# Patient Record
Sex: Male | Born: 1935 | Race: White | Hispanic: No | Marital: Married | Smoking: Former smoker
Health system: Southern US, Community
[De-identification: ages and names within clinical notes are randomized; demographics above are authoritative.]

## PROBLEM LIST (undated history)

## (undated) DIAGNOSIS — E785 Hyperlipidemia, unspecified: Secondary | ICD-10-CM

## (undated) DIAGNOSIS — A91 Dengue hemorrhagic fever: Secondary | ICD-10-CM

## (undated) DIAGNOSIS — I679 Cerebrovascular disease, unspecified: Secondary | ICD-10-CM

## (undated) DIAGNOSIS — F329 Major depressive disorder, single episode, unspecified: Secondary | ICD-10-CM

## (undated) DIAGNOSIS — I10 Essential (primary) hypertension: Secondary | ICD-10-CM

## (undated) DIAGNOSIS — C61 Malignant neoplasm of prostate: Secondary | ICD-10-CM

## (undated) DIAGNOSIS — I2510 Atherosclerotic heart disease of native coronary artery without angina pectoris: Secondary | ICD-10-CM

## (undated) DIAGNOSIS — F32A Depression, unspecified: Secondary | ICD-10-CM

## (undated) HISTORY — DX: Cerebrovascular disease, unspecified: I67.9

## (undated) HISTORY — DX: Depression, unspecified: F32.A

## (undated) HISTORY — DX: Essential (primary) hypertension: I10

## (undated) HISTORY — PX: OTHER SURGICAL HISTORY: SHX169

## (undated) HISTORY — DX: Dengue hemorrhagic fever: A91

## (undated) HISTORY — DX: Major depressive disorder, single episode, unspecified: F32.9

## (undated) HISTORY — PX: CORONARY ARTERY BYPASS GRAFT: SHX141

## (undated) HISTORY — DX: Malignant neoplasm of prostate: C61

## (undated) HISTORY — DX: Hyperlipidemia, unspecified: E78.5

## (undated) HISTORY — DX: Atherosclerotic heart disease of native coronary artery without angina pectoris: I25.10

---

## 1999-04-09 ENCOUNTER — Other Ambulatory Visit: Admission: RE | Admit: 1999-04-09 | Discharge: 1999-04-09 | Payer: Self-pay | Admitting: Urology

## 1999-05-15 ENCOUNTER — Encounter: Payer: Self-pay | Admitting: Urology

## 1999-05-20 ENCOUNTER — Inpatient Hospital Stay (HOSPITAL_COMMUNITY): Admission: RE | Admit: 1999-05-20 | Discharge: 1999-05-23 | Payer: Self-pay | Admitting: Urology

## 1999-05-20 ENCOUNTER — Encounter: Payer: Self-pay | Admitting: Urology

## 2002-11-01 HISTORY — PX: PROSTATE SURGERY: SHX751

## 2004-09-11 ENCOUNTER — Ambulatory Visit: Payer: Self-pay | Admitting: Family Medicine

## 2005-01-08 ENCOUNTER — Ambulatory Visit: Payer: Self-pay | Admitting: Family Medicine

## 2005-06-07 ENCOUNTER — Ambulatory Visit: Payer: Self-pay | Admitting: Family Medicine

## 2005-06-28 ENCOUNTER — Ambulatory Visit: Payer: Self-pay | Admitting: Family Medicine

## 2005-07-14 ENCOUNTER — Ambulatory Visit: Payer: Self-pay | Admitting: Family Medicine

## 2005-07-19 ENCOUNTER — Ambulatory Visit: Payer: Self-pay | Admitting: Family Medicine

## 2005-07-23 ENCOUNTER — Ambulatory Visit: Payer: Self-pay | Admitting: Family Medicine

## 2005-08-04 ENCOUNTER — Ambulatory Visit: Payer: Self-pay | Admitting: Cardiology

## 2005-08-13 ENCOUNTER — Ambulatory Visit: Payer: Self-pay | Admitting: Family Medicine

## 2005-08-18 ENCOUNTER — Ambulatory Visit: Payer: Self-pay | Admitting: Family Medicine

## 2005-08-23 ENCOUNTER — Ambulatory Visit: Payer: Self-pay

## 2005-10-12 ENCOUNTER — Ambulatory Visit: Payer: Self-pay | Admitting: Family Medicine

## 2005-10-19 ENCOUNTER — Ambulatory Visit: Payer: Self-pay | Admitting: Family Medicine

## 2005-11-08 ENCOUNTER — Ambulatory Visit: Payer: Self-pay | Admitting: Family Medicine

## 2006-01-06 ENCOUNTER — Ambulatory Visit: Payer: Self-pay | Admitting: Family Medicine

## 2007-09-12 ENCOUNTER — Ambulatory Visit: Payer: Self-pay

## 2007-09-12 ENCOUNTER — Ambulatory Visit: Payer: Self-pay | Admitting: Cardiology

## 2008-09-20 ENCOUNTER — Ambulatory Visit: Payer: Self-pay | Admitting: Cardiology

## 2008-10-04 ENCOUNTER — Ambulatory Visit: Payer: Self-pay

## 2009-03-20 DIAGNOSIS — I25810 Atherosclerosis of coronary artery bypass graft(s) without angina pectoris: Secondary | ICD-10-CM | POA: Insufficient documentation

## 2009-03-20 DIAGNOSIS — E785 Hyperlipidemia, unspecified: Secondary | ICD-10-CM

## 2009-03-20 DIAGNOSIS — Z8679 Personal history of other diseases of the circulatory system: Secondary | ICD-10-CM | POA: Insufficient documentation

## 2009-03-20 DIAGNOSIS — I10 Essential (primary) hypertension: Secondary | ICD-10-CM

## 2009-03-24 ENCOUNTER — Encounter: Payer: Self-pay | Admitting: Nurse Practitioner

## 2009-03-24 ENCOUNTER — Ambulatory Visit: Payer: Self-pay | Admitting: Cardiovascular Disease

## 2009-05-26 ENCOUNTER — Telehealth (INDEPENDENT_AMBULATORY_CARE_PROVIDER_SITE_OTHER): Payer: Self-pay | Admitting: *Deleted

## 2009-05-29 ENCOUNTER — Inpatient Hospital Stay (HOSPITAL_COMMUNITY): Admission: RE | Admit: 2009-05-29 | Discharge: 2009-06-02 | Payer: Self-pay | Admitting: Orthopedic Surgery

## 2009-06-26 ENCOUNTER — Encounter (INDEPENDENT_AMBULATORY_CARE_PROVIDER_SITE_OTHER): Payer: Self-pay | Admitting: *Deleted

## 2009-08-08 ENCOUNTER — Encounter: Payer: Self-pay | Admitting: Cardiology

## 2009-10-07 ENCOUNTER — Encounter: Payer: Self-pay | Admitting: Cardiology

## 2011-02-06 LAB — GLUCOSE, CAPILLARY
Glucose-Capillary: 107 mg/dL — ABNORMAL HIGH (ref 70–99)
Glucose-Capillary: 113 mg/dL — ABNORMAL HIGH (ref 70–99)
Glucose-Capillary: 130 mg/dL — ABNORMAL HIGH (ref 70–99)

## 2011-02-06 LAB — CBC
HCT: 30.9 % — ABNORMAL LOW (ref 39.0–52.0)
MCHC: 34.2 g/dL (ref 30.0–36.0)
MCV: 90.1 fL (ref 78.0–100.0)
RBC: 3.43 MIL/uL — ABNORMAL LOW (ref 4.22–5.81)
RDW: 12.4 % (ref 11.5–15.5)
WBC: 6.6 10*3/uL (ref 4.0–10.5)

## 2011-02-07 LAB — COMPREHENSIVE METABOLIC PANEL
ALT: 15 U/L (ref 0–53)
Alkaline Phosphatase: 38 U/L — ABNORMAL LOW (ref 39–117)
CO2: 27 mEq/L (ref 19–32)
Calcium: 9.4 mg/dL (ref 8.4–10.5)
GFR calc non Af Amer: 60 mL/min — ABNORMAL LOW (ref >60)
Glucose, Bld: 97 mg/dL (ref 70–99)
Sodium: 139 mEq/L (ref 135–145)
Total Bilirubin: 0.4 mg/dL (ref 0.3–1.2)

## 2011-02-07 LAB — CROSSMATCH

## 2011-02-07 LAB — DIFFERENTIAL
Basophils Absolute: 0.1 10*3/uL (ref 0.0–0.1)
Basophils Relative: 1 % (ref 0–1)
Eosinophils Absolute: 0.2 10*3/uL (ref 0.0–0.7)
Neutrophils Relative %: 62 % (ref 43–77)

## 2011-02-07 LAB — URINALYSIS, ROUTINE W REFLEX MICROSCOPIC
Bilirubin Urine: NEGATIVE
Hgb urine dipstick: NEGATIVE
Protein, ur: NEGATIVE mg/dL
Urobilinogen, UA: 0.2 mg/dL (ref 0.0–1.0)

## 2011-02-07 LAB — GLUCOSE, CAPILLARY
Glucose-Capillary: 129 mg/dL — ABNORMAL HIGH (ref 70–99)
Glucose-Capillary: 133 mg/dL — ABNORMAL HIGH (ref 70–99)
Glucose-Capillary: 139 mg/dL — ABNORMAL HIGH (ref 70–99)
Glucose-Capillary: 148 mg/dL — ABNORMAL HIGH (ref 70–99)
Glucose-Capillary: 161 mg/dL — ABNORMAL HIGH (ref 70–99)
Glucose-Capillary: 164 mg/dL — ABNORMAL HIGH (ref 70–99)

## 2011-02-07 LAB — CBC
HCT: 29.8 % — ABNORMAL LOW (ref 39.0–52.0)
Hemoglobin: 10.1 g/dL — ABNORMAL LOW (ref 13.0–17.0)
Hemoglobin: 12.9 g/dL — ABNORMAL LOW (ref 13.0–17.0)
Hemoglobin: 9.1 g/dL — ABNORMAL LOW (ref 13.0–17.0)
MCHC: 34.2 g/dL (ref 30.0–36.0)
RBC: 2.91 MIL/uL — ABNORMAL LOW (ref 4.22–5.81)
RBC: 3.25 MIL/uL — ABNORMAL LOW (ref 4.22–5.81)
RBC: 4.14 MIL/uL — ABNORMAL LOW (ref 4.22–5.81)
RDW: 12 % (ref 11.5–15.5)
WBC: 7.7 10*3/uL (ref 4.0–10.5)

## 2011-02-07 LAB — BASIC METABOLIC PANEL
Calcium: 8 mg/dL — ABNORMAL LOW (ref 8.4–10.5)
Calcium: 8.3 mg/dL — ABNORMAL LOW (ref 8.4–10.5)
GFR calc Af Amer: >60 mL/min (ref >60)
GFR calc Af Amer: >60 mL/min (ref >60)
GFR calc non Af Amer: >60 mL/min (ref >60)
GFR calc non Af Amer: >60 mL/min (ref >60)
Glucose, Bld: 136 mg/dL — ABNORMAL HIGH (ref 70–99)
Potassium: 4.8 mEq/L (ref 3.5–5.1)
Sodium: 138 mEq/L (ref 135–145)
Sodium: 140 mEq/L (ref 135–145)

## 2011-02-07 LAB — PROTIME-INR
INR: 1 (ref 0.00–1.49)
Prothrombin Time: 13.9 seconds (ref 11.6–15.2)

## 2011-02-07 LAB — ABO/RH: ABO/RH(D): O POS

## 2011-03-16 NOTE — Assessment & Plan Note (Signed)
[facility]                            CARDIOLOGY OFFICE NOTE   NAME:Sean Lin, Sean Lin                      MRN:          [id number]  DATE:09/12/2007                            DOB:          [birth year]    Sean Lin is a 76 year old gentleman who has a history of coronary  artery bypassing graft in 1998 and preserved LV function.  He also has  mild cerebrovascular disease.  Since I last saw him he occasionally has  mild dyspnea on exertion but there is no orthopnea or PND.  He also  occasionally has mild pedal edema but has had this ever since his bypass  surgery.  He has not had chest pain or syncope.   His medications include:  1. Aspirin 325 mg p.o. daily.  2. Prilosec 20 mg p.o. daily.  3. Albuterol inhaler.  4. Advair.  5. Lipitor 20 mg p.o. daily.  6. Diovan 320 mg.  7. Hydrochlorothiazide.   He is not clear on all of his medications.   PHYSICAL EXAM TODAY:  Shows a blood pressure that is elevated at 175/91  and his pulse is 72.  He weighs 206 pounds.  HEENT:  Normal.  NECK:  Supple.  CHEST:  Clear.  CARDIOVASCULAR:  Reveals a regular rate and rhythm.  ABDOMEN:  Shows no tenderness.  EXTREMITIES:  Show no edema.   Electrocardiogram shows a normal sinus rhythm at a rate of 64.  There  are no ST changes noted.   DIAGNOSES:  1. Coronary artery disease status post coronary artery bypassing graft      - Sean Lin has had no change in his symptoms and he has not had      chest pain.  His Myoview on August 23, 2005, showed an ejection      fraction of 64 and normal perfusion.  We will therefore continue      with medical therapy including his aspirin and statin as well as      his ARB.  2. Hypertension - his blood pressure is elevated today.  I have asked      him to contact us with a complete list of his medications and we      will adjust as indicated.  He will continue to track his blood      pressure at home and we will increase  his medications as needed.      We will have his most recent BMET forwarded to [place] from Dr.      Trey Sailors office.  3. History of cerebrovascular disease - he did have followup carotid      Dopplers today and we will await the final report.  4. Hyperlipidemia - he will continue on his Lipitor and we asked for      his most recent lipids and liver to be forwarded to [place] for our      records.  5. History of prostate cancer status post prostatectomy.   We will see him back in approximately 12 months.  He will continue with  risk factor modification.  Madolyn Frieze Jens Som, MD, [facility]  Electronically Signed    BSC/MedQ  DD: 09/12/2007  DT: 09/12/2007  Job #: [id number]   cc:   Elease Hashimoto A. Benedetto Goad, M.D.

## 2011-03-16 NOTE — Op Note (Signed)
NAME:  Sean Lin, Sean Lin               ACCOUNT NO.:  [id number]   MEDICAL RECORD NO.:  [id number]          PATIENT TYPE:  INP   LOCATION:  0001                         FACILITY:  [facility]   PHYSICIAN:  John L. Rendall, M.D.  DATE OF BIRTH:  06-24-1935   DATE OF PROCEDURE:  05/29/2009  DATE OF DISCHARGE:                               OPERATIVE REPORT   PREOPERATIVE DIAGNOSIS:  Osteoarthritis, right hip.   SURGICAL PROCEDURE:  Right AML total hip replacement.   POSTOPERATIVE DIAGNOSIS:  Osteoarthritis, right hip.   SURGEON:  John L. Rendall, M.D.   ASSISTANT:  Legrand Pitts. Duffy, P.A. --  present and participating  throughout the entire procedure.   ANESTHESIA:  General.   PATHOLOGY:  Bone-against-bone right hip, with pain with each step.  Getting worse to the point where he was requiring chronic mild-to-  moderate pain medication.   PROCEDURE:  Under general anesthesia, the patient was placed in the left  lateral decubitus position and the hip is prepared with DuraPrep and  draped as a sterile field.  He received preoperative antibiotics.  A  posterior approach was made, splitting the IT band in the line of its  fibers; spreading it with a Charnley retractor.  The short external  rotators and hip capsule were taken down from bone with electrocautery.  Multiple small vessels were cauterized.  The hip capsule was then opened  in a T-shaped manner.  The hip is dislocated.  The superior femoral neck  is exposed, and IM initiator and canal finder are used.  Progressive  reaming up to 14.5 is performed.  The femoral neck is then osteotomized,  and an arthritic hip ball is removed.  Rasping is then done; 10.5, 12,  13, 5 and 15 narrow, which gives an excellent fit down in the canal and  through the isthmus.   At this point, attention is turned to the acetabulum.  The ligamentum  Teres is removed.  The wing retractors are inserted in the interval  between the capsule and the labrum.  The  labrum was then excised.  Two  Cobra retractors were placed inferiorly.  Progressive reaming was then  done (47, 49, 51, 53, 54 and 55).  Appropriate deepening of the  acetabulum is done down to nice bleeding bone.  A 54 trial bottoms out  nicely, and a 55 reamer gave a good rim fit.  Decision is made to go to  a 56, 300 series.  This was inserted using the Sputnik to aid in  positioning.  The position of the socket pretty much mirrored the  anatomic rim.  Once this was in place, a trial seating of a +4 10-degree  lip poly was done, and trialed off a rasp -- it was a +2, 36 hip ball.  It was stable through full normal range of motion, but had a positive  shuck test of about a centimeter.  Leg lengths appeared very near equal.   At this point, permanent components are obtained.  The apex hole  eliminator is inserted.  The permanent Pinnacle Marathon acetabular  liner (plus-4 10-degree  liner, 56 outside diameter and 36 inside).  With  this in place, the small stature AML (155 length, 43 offset) was then  inserted.  It seated nicely on the calcar.  Trial of the +2 hip ball  still revealed a little more shuck test than desired, and a +5 trial  revealed excellent stability through full flexion, extension, internal  and external rotation.  The permanent component was then obtained (a 36  +5, with a 12/14 taper).  This was inserted in the usual manner.  The  hip was reduced.  Irrigation was performed.  The hip capsule was then  closed with #1 Tycron.  The piriformis reattached with #1 Tycron.  Short  external rotators loosely reattached, and the IT band closed with #1  Tycron.  The subcutaneous was closed in  2 layers of 0 and  2-0 Vicryl;  and skin with clips.   OPERATIVE TIME:  1 hour.   BLOOD LOSS:  Estimated 200 mL.   Leg lengths equal at the end of the case.      John L. Rendall, M.D.  Electronically Signed     JLR/MEDQ  D:  05/29/2009  T:  05/29/2009  Job:  [id number]

## 2011-03-16 NOTE — Assessment & Plan Note (Signed)
[facility]                            CARDIOLOGY OFFICE NOTE   NAME:Sean Lin, Sean Lin                      MRN:          [id number]  DATE:09/20/2008                            DOB:          Aug 12, 1935    Mr. Mentink is a pleasant gentleman who has a history of coronary artery  bypassing graft in 1998 and preserved LV function.  He also has  cerebrovascular disease.  His last Myoview on August 23, 2005, showed  normal perfusion and an ejection fraction of 64%.  Since I last saw him,  he has dyspnea with more extreme activities, but not with routine  activities.  There is no orthopnea, PND, palpitations, presyncope,  syncope, or exertional chest pain.  Occasionally, he has edema in the  right lower extremity where his veins were harvested.   MEDICATIONS:  1. Aspirin 325 mg p.o. daily.  2. Diovan 320 mg daily.  3. Metformin 500 mg p.o. b.i.d.  4. Lipitor 40 mg p.o. daily.  5. Prilosec.   PHYSICAL EXAMINATION:  VITAL SIGNS:  Today shows a blood pressure of  137/65.  His pulse is 73.  He weighs 193 pounds.  HEENT:  Normal.  NECK:  Supple.  CHEST:  Clear.  CARDIOVASCULAR:  Regular rhythm.  ABDOMEN:  No tenderness.  EXTREMITIES:  No edema.   Electrocardiogram shows a sinus rhythm at a rate of 71.  The axis is  normal.  There are minor nonspecific ST changes.   DIAGNOSES:  1. Coronary artery disease, status post coronary artery bypassing      graft - Mr. Hilliker appears to be doing reasonably well from a      symptomatic standpoint.  He has some dyspnea on exertion.  It has      been 3 years since his last Myoview and we will plan to repeat      that.  If it shows normal perfusion, then we will continue medical      therapy including his aspirin, ARB, and statin.  2. Hypertension - His blood pressure is adequately controlled on his      present medications.  Dr. Benedetto Goad is following his renal function.  3. History of cerebrovascular disease - He  is due for followup carotid      Dopplers and we will arrange those as well.  He will continue on      his aspirin and statin.  4. Hyperlipidemia - He will continue on his statin and this is being      followed by Dr. Benedetto Goad.  5. History of prostate cancer.   He will continue the risk factor modification including diet and  exercise.  He does not smoke.  I will see him back in 1 year.     Madolyn Frieze Jens Som, MD, [facility]  Electronically Signed    BSC/MedQ  DD: 09/20/2008  DT: 09/21/2008  Job #: [id number]   cc:   Elease Hashimoto A. Benedetto Goad, M.D.

## 2011-03-16 NOTE — Discharge Summary (Signed)
NAME:  Sean Lin, Sean Lin               ACCOUNT NO.:  [id number]   MEDICAL RECORD NO.:  [id number]          PATIENT TYPE:  INP   LOCATION:  1614                         FACILITY:  [facility]   PHYSICIAN:  John L. Rendall, M.D.  DATE OF BIRTH:  July 27, 1935   DATE OF ADMISSION:  05/29/2009  DATE OF DISCHARGE:  06/02/2009                               DISCHARGE SUMMARY   ADMISSION DIAGNOSES:  1. End-stage osteoarthritis, right hip.  2. Hypertension.  3. Type 2 diabetes mellitus.  4. Hypercholesterolemia.  5. Coronary artery disease with history of coronary artery bypass      graft.  6. History of skin cancer.  7. History of prostate cancer.  8. History of low back pain.   DISCHARGE DIAGNOSES:  1. End-stage osteoarthritis, right hip, status post right total hip      arthroplasty.  2. Acute blood loss anemia secondary to surgery.  3. Postoperative nausea, now resolved.  4. Hypertension.  5. Type 2 diabetes mellitus.  6. Hypercholesterolemia.  7. Coronary artery disease with history of coronary artery bypass      graft.  8. History of skin cancer.  9. History of prostate cancer.  10.History of low back pain.   SURGICAL PROCEDURES:  On May 29, 2009, Sean Lin underwent a right  total hip arthroplasty by Dr. Jonny Ruiz L.  Rendall, assisted by Arnoldo Morale,  PA-C.  He had a Pinnacle 300 series acetabular cup, size 56-mm placed  with an apex hole eliminator and a Pinnacle Marathon acetabular liner  +4, 10-degree 36-mm inner diameter, 56-mm outer diameter.  An AML small  stature 155-mm length, 43-mm offset size 15 femoral stem with a metal-on-  metal femoral head 36-mm, +5 neck length, 12/14 taper.   COMPLICATIONS:  None.   CONSULTS:  1. Physical therapy consult May 30, 2009, in addition to a case      management consult.  2. Occupational therapy consult May 31, 2009.   HISTORY OF PRESENT ILLNESS:  This 76 year old white male patient  presented to Dr. Priscille Kluver with a 1-year history of  sudden onset  progressive right hip pain without injury or prior surgery.  Pain at  this time is intermittent, sharp ache over the right groin and buttock  with radiation to the knee.  It increases if he turns the wrong way or  walks and then decreases with sitting and lying down.  Tramadol provides  some relief.  X-rays show end-stage arthritic changes and he has failed  conservative treatment.  Because of that, he is presenting for right hip  replacement.   HOSPITAL COURSE:  Sean Lin tolerated his surgical procedure well  without immediate postoperative complications.  He was transferred to  the orthopedic floor.  On postop day 1, he had had some nausea and  vomiting the day before, not sure whether it was due to the Percocet or  anesthesia, but meds were adjusted.  T-max was 99.2, vitals were stable.  Dressing was intact to the right hip.  The leg was neurovascularly  intact.  He was switched to p.o. pain meds.   Postop day  2 he was feeling better.  Nausea had resolved.  Hemoglobin  9.1, hematocrit 26.6.  Incision was well-approximated with staples.  There was no drainage.  He was switched totally to p.o. pain meds and  continued on therapy.   On postop day 3 his hemoglobin dropped to 7.6.  There was no noted  active bleeding.  Vitals were otherwise stable.  He was transfused with  2 units of packed red blood cells and continued on therapy.  Today on  postop day 4 he is feeling better.  T-max 9.4, vitals stable.  Hemoglobin 10.6, hematocrit 30.9  He is noted to have extensive  ecchymosis about the thigh and buttock on the right.  Incision is well-  approximated with minimal serosanguineous drainage.  The leg is  neurovascularly intact.  It is felt he is doing well enough from a  therapy standpoint that he is ready for discharge home and will be  discharged home later today.   DISCHARGE INSTRUCTIONS:  Diet:  He is to resume his regular diabetic  diet.   MEDICATIONS:  He needs  to resume his home meds as follows:  1. Diovan 320 mg p.o. q.a.m.  2. Felodipine 10 mg p.o. q.a.m.  3. Lipitor 40 mg p.o. q.p.m.  4. Metformin 500 mg p.o. b.i.d.  5. Fenofibrate 134 mg p.o. q.p.m.  6. Tramadol he can use for pain when he is not using Dilaudid.  7. Albuterol inhaler 2 puffs b.i.d. p.r.n.  8. Qvar 80 mcg 2 puffs inhaled b.i.d.  9. Aspirin.  He is to resume once he has completed his Arixtra.  10.Prilosec 20 mg p.o. q.p.m.  11.Multivitamin 1 tablet p.o. q.a.m.  12.Vitamin C 500 mg p.o. q.a.m.   Additional meds at this time include:  1. Arixtra 2.5 mg subcu q.8 p.m. with the last dose on June 07, 2009.      On June 08, 2009, he is to resume his daily aspirin.  He is given      4 with no refill.  He does have 2 samples at home.  2. Celebrex 200 mg p.o. b.i.d. for 1 week, then 1 tablet p.o. daily,      30 with no refill.  3. Robaxin 500 mg 1 to 2 tablets p.o. q.6 hours p.r.n. for spasms, 60      with no refill.  4. Dilaudid 2 mg 1 to 2 tablets p.o. q.4 hours p.r.n. for pain, 60      with no refill.  5. He is also asked to take an iron supplement twice a day for 1      month.   ACTIVITY:  He can be out of bed, weightbearing as tolerated on the right  leg with the use of a walker.  No lifting or driving for 6 weeks.  Please see the blue total hip discharge sheet for further activity  instructions.   WOUND CARE:  He may shower after no drainage from the wound for 2 days.  Please clean the incision daily with Betadine and apply a dry dressing.  Please change it when saturated.  Please see the blue total hip  discharge sheet for further wound care instructions.   FOLLOWUP:  He is to follow up with Dr. Priscille Kluver in our office on Tuesday,  June 10, 2009, and needs to call [phone] for that appointment.  He is  arranged for home health physical therapy.   LABORATORY DATA:  Chest x-ray on May 27, 2009, showed hyperinflated  lungs  consistent with obstructive pulmonary  disease, but no pulmonary  edema, pneumonia, or acute process.  X-ray taken of the right hip after  surgery shows the prosthesis in good position and alignment.  The  lateral view was obscured, however.   Hemoglobin and hematocrit ranged from 12.9 and 37.7 on May 27, 2009, to  7.6 and 22.3 on June 01, 2009, to 10.6 and 30.9 on 06/02/2009.  White  count and platelets remained within normal limits.  Glucose ranged from  97 on May 27, 2009, to 138 on May 31, 2009.  Alk phos was a low at 38  on May 27, 2009.  Calcium ranged from 9.4 on May 27, 2009, to 8.0 on  May 31, 2009.  All other laboratory studies were within normal limits.      Legrand Pitts Duffy, P.A.      John L. Rendall, M.D.  Electronically Signed    KED/MEDQ  D:  06/02/2009  T:  06/02/2009  Job:  [id number]

## 2011-05-03 ENCOUNTER — Encounter: Payer: Self-pay | Admitting: Cardiology

## 2011-05-25 ENCOUNTER — Ambulatory Visit (INDEPENDENT_AMBULATORY_CARE_PROVIDER_SITE_OTHER): Payer: Medicare Other | Admitting: Cardiology

## 2011-05-25 ENCOUNTER — Encounter: Payer: Self-pay | Admitting: Cardiology

## 2011-05-25 DIAGNOSIS — I6529 Occlusion and stenosis of unspecified carotid artery: Secondary | ICD-10-CM

## 2011-05-25 DIAGNOSIS — I2510 Atherosclerotic heart disease of native coronary artery without angina pectoris: Secondary | ICD-10-CM

## 2011-05-25 DIAGNOSIS — E7800 Pure hypercholesterolemia, unspecified: Secondary | ICD-10-CM

## 2011-05-25 DIAGNOSIS — Z79899 Other long term (current) drug therapy: Secondary | ICD-10-CM

## 2011-05-25 DIAGNOSIS — R0602 Shortness of breath: Secondary | ICD-10-CM

## 2011-05-25 DIAGNOSIS — R0600 Dyspnea, unspecified: Secondary | ICD-10-CM | POA: Insufficient documentation

## 2011-05-25 MED ORDER — FUROSEMIDE 20 MG PO TABS: 20.0000 mg | ORAL_TABLET | Freq: Every day | ORAL | Status: DC

## 2011-05-25 MED ORDER — PRAVASTATIN SODIUM 80 MG PO TABS: 80.0000 mg | ORAL_TABLET | Freq: Every evening | ORAL | Status: DC

## 2011-05-25 NOTE — Assessment & Plan Note (Addendum)
Continue aspirin and statin. Schedule followup carotid Dopplers. 

## 2011-05-25 NOTE — Assessment & Plan Note (Signed)
Blood pressure mildly elevated. Gentle diuresis should help.

## 2011-05-25 NOTE — Assessment & Plan Note (Signed)
Continue aspirin and statin. Scheduled followup Myoview.

## 2011-05-25 NOTE — Assessment & Plan Note (Signed)
Patient is volume overloaded on examination. There may be also a component of asthma. Plan check echocardiogram. Add Lasix 20 mg daily. Check potassium and renal function in one week.

## 2011-05-25 NOTE — Progress Notes (Signed)
HPI: Pleasant gentleman in for followup of coronary artery disease. Patient underwent coronary artery bypass graft in 1998. His LV function has been preserved. Last Myoview in Dec 2009 showed normal perfusion. He also has a history of cerebrovascular disease. He has not been seen in this office since 2010. Over the past 6 months the patient has had increased dyspnea on exertion. There is a question of orthopnea. He has also noticed increased pedal edema. There has been no chest pain. He has been treated for asthma with some improvement.  Current Outpatient Prescriptions  Medication Sig Dispense Refill  . albuterol (VENTOLIN HFA) 108 (90 BASE) MCG/ACT inhaler Inhale 2 puffs into the lungs 2 (two) times daily.        Marland Kitchen aspirin EC 325 MG tablet Take 325 mg by mouth daily.        . beclomethasone (QVAR) 80 MCG/ACT inhaler Inhale 2 puffs into the lungs 2 (two) times daily.        . felodipine (PLENDIL) 10 MG 24 hr tablet Take 10 mg by mouth daily.        Marland Kitchen losartan-hydrochlorothiazide (HYZAAR) 100-12.5 MG per tablet Take 1 tablet by mouth daily.        . Multiple Vitamins-Minerals (MULTIVITAL) tablet Take 1 tablet by mouth daily.        Marland Kitchen omeprazole (PRILOSEC OTC) 20 MG tablet Take 20 mg by mouth daily.        . simvastatin (ZOCOR) 80 MG tablet Take 80 mg by mouth at bedtime.        . traMADol (ULTRAM) 50 MG tablet Take 50 mg by mouth every 6 (six) hours as needed.        . vitamin C (ASCORBIC ACID) 500 MG tablet Take 500 mg by mouth daily.           Past Medical History  Diagnosis Date  . CAD (coronary artery disease)     A. 03/21/97: s/p CABG x4 (pvt) - LIM[place] - LAD; SVG -DIAG; SVG-OM; SVG-RCA B. 10/04/2008: EX. MV - EX. TIME 5:15; MAX HR 155; EF 65%; LOW RISK STUDY WITH NL     . Post-perfusion syndrome     mildly  . Depression   . HTN (hypertension)   . HLD (hyperlipidemia)   . Cerebrovascular disease   . Prostate cancer     hx  . [place] hemorrhagic fever     pending r tha ??    Past  Surgical History  Procedure Date  . Coronary artery bypass graft     1998    History   Social History  . Marital Status: Married    Spouse Name: N/A    Number of Children: N/A  . Years of Education: N/A   Occupational History  . Not on file.   Social History Main Topics  . Smoking status: Former Games developer  . Smokeless tobacco: Not on file   Comment: quit in 1989. Smoked for 40 years, up to two packs a day   . Alcohol Use:   . Drug Use:   . Sexually Active:    Other Topics Concern  . Not on file   Social History Narrative  . No narrative on file    ROS: no fevers or chills, productive cough, hemoptysis, dysphasia, odynophagia, melena, hematochezia, dysuria, hematuria, rash, seizure activity, orthopnea, PND, claudication. Remaining systems are negative.  Physical Exam: Well-developed well-nourished in no acute distress.  Skin is warm and dry.  HEENT is normal.  Neck is  supple. No thyromegaly.  Chest is diminished breath sounds throughout with mild expiratory wheeze Cardiovascular exam is regular rate and rhythm.  Abdominal exam nontender or distended. No masses palpated. Extremities show 1+ edema. neuro grossly intact  ECG normal sinus rhythm at a rate of 71. Nonspecific ST changes.

## 2011-05-25 NOTE — Assessment & Plan Note (Signed)
Given previous FDA warning will discontinue high-dose Zocor and begin Pravachol 80 mg daily. Check lipids and liver in 6 weeks.

## 2011-05-25 NOTE — Patient Instructions (Signed)
Your physician recommends that you schedule a follow-up appointment in: 6 WEEKS  Your physician has requested that you have a lexiscan myoview. For further information please visit [email or web address] Please follow instruction sheet, as given.   Your physician has requested that you have an echocardiogram. Echocardiography is a painless test that uses sound waves to create images of your heart. It provides your doctor with information about the size and shape of your heart and how well your heart's chambers and valves are working. This procedure takes approximately one hour. There are no restrictions for this procedure.   Your physician has requested that you have a carotid duplex. This test is an ultrasound of the carotid arteries in your neck. It looks at blood flow through these arteries that supply the brain with blood. Allow one hour for this exam. There are no restrictions or special instructions.   Your physician recommends that you return for lab work in: ONE WEEK  Your physician recommends that you return for a FASTING lipid profile: 6 WEEKS  STOP SIMVASTATIN  START PRAVASTATIN 80MG  ONCE DAILY  START FUROSEMIDE 20MG  ONCE DAILY

## 2011-06-01 ENCOUNTER — Encounter: Payer: Self-pay | Admitting: Cardiology

## 2011-06-01 ENCOUNTER — Other Ambulatory Visit: Payer: Medicare Other | Admitting: *Deleted

## 2011-06-02 ENCOUNTER — Encounter: Payer: Self-pay | Admitting: *Deleted

## 2011-06-08 ENCOUNTER — Ambulatory Visit (HOSPITAL_BASED_OUTPATIENT_CLINIC_OR_DEPARTMENT_OTHER): Payer: Medicare Other | Admitting: Radiology

## 2011-06-08 ENCOUNTER — Encounter (INDEPENDENT_AMBULATORY_CARE_PROVIDER_SITE_OTHER): Payer: Medicare Other | Admitting: *Deleted

## 2011-06-08 ENCOUNTER — Ambulatory Visit (HOSPITAL_COMMUNITY): Payer: Medicare Other | Attending: Cardiology | Admitting: Radiology

## 2011-06-08 VITALS — Ht 69.0 in | Wt 202.0 lb

## 2011-06-08 DIAGNOSIS — I6529 Occlusion and stenosis of unspecified carotid artery: Secondary | ICD-10-CM

## 2011-06-08 DIAGNOSIS — I2510 Atherosclerotic heart disease of native coronary artery without angina pectoris: Secondary | ICD-10-CM | POA: Insufficient documentation

## 2011-06-08 DIAGNOSIS — I25810 Atherosclerosis of coronary artery bypass graft(s) without angina pectoris: Secondary | ICD-10-CM

## 2011-06-08 DIAGNOSIS — R0989 Other specified symptoms and signs involving the circulatory and respiratory systems: Secondary | ICD-10-CM

## 2011-06-08 DIAGNOSIS — R0602 Shortness of breath: Secondary | ICD-10-CM

## 2011-06-08 MED ORDER — TECHNETIUM TC 99M TETROFOSMIN IV KIT
11.0000 | PACK | Freq: Once | INTRAVENOUS | Status: AC | PRN
  Administered 2011-06-08: 11 via INTRAVENOUS

## 2011-06-08 MED ORDER — TECHNETIUM TC 99M TETROFOSMIN IV KIT
33.0000 | PACK | Freq: Once | INTRAVENOUS | Status: AC | PRN
  Administered 2011-06-08: 33 via INTRAVENOUS

## 2011-06-08 MED ORDER — REGADENOSON 0.4 MG/5ML IV SOLN
0.4000 mg | Freq: Once | INTRAVENOUS | Status: AC
  Administered 2011-06-08: 0.4 mg via INTRAVENOUS

## 2011-06-08 NOTE — Progress Notes (Signed)
[facility] SITE 3 NUCLEAR MED [address] [place] [address] [phone]  Cardiology Nuclear Med Study  Sean Lin is a 76 y.o. male [id number] September 22, 1935   Nuclear Med Background Indication for Stress Test:  Evaluation for Ischemia and Graft Patency History:  '98 Cath> CABG x 4;'09 MPS: NL, EF=65%; Asthma Cardiac Risk Factors: Carotid Disease, CVA, History of Smoking, Hypertension, Lipids, NIDDM and PVD  Symptoms:  Dizziness, DOE, Fatigue, Fatigue with Exertion and Pedal Edema   Nuclear Pre-Procedure Caffeine/Decaff Intake:  9:00pm NPO After: 600pm   Lungs:  Clear IV 0.9% NS with Angio Cath:  20g  IV Site: R Antecubital  IV Started by:  Cathlyn Parsons, RN  Chest Size (in):  44 Cup Size: n/a  Height: 5\' 9"  (1.753 m)  Weight:  202 lb (91.627 kg)  BMI:  Body mass index is 29.83 kg/(m^2). Tech Comments:  Patient did Albuterol inh and Qvar this am.  Lungs clear and O2 sat 97% prior to test.    Nuclear Med Study 1 or 2 day study: 1 day  Stress Test Type:  Treadmill/Lexiscan  Reading MD: Olga Millers, MD  Order Authorizing Provider:  Olga Millers, MD  Resting Radionuclide: Technetium 70m Tetrofosmin  Resting Radionuclide Dose: 10.8 mCi   Stress Radionuclide:  Technetium 30m Tetrofosmin  Stress Radionuclide Dose: 33 mCi           Stress Protocol Rest HR: 68 Stress HR: 122  Rest BP: 150/69 Stress BP: 208/45  Exercise Time (min): 1:00 METS: n/a          Dose of Adenosine (mg):  n/a Dose of Lexiscan: 0.4 mg  Dose of Atropine (mg): n/a Dose of Dobutamine: n/a mcg/kg/min (at max HR)  Stress Test Technologist: Irean Hong, RN  Nuclear Technologist:  Domenic Polite, CNMT     Rest Procedure:  Myocardial perfusion imaging was performed at rest 45 minutes following the intravenous administration of Technetium 36m Tetrofosmin. Rest ECG: NSR with minimal ST depression inferiorly  Stress Procedure:  The patient received IV Lexiscan 0.4 mg  over 15-seconds with concurrent low level exercise.  Technetium 75m Tetrofosmin was injected at 30-seconds while the patient continued walking.  There were nonspecific ST-T changes with Lexiscan. There was a mild hypertensive response.  Quantitative spect images were obtained after a 45-minute delay. Stress ECG: Significant ST abnormalities consistent with ischemia.  QPS Raw Data Images:  Acquisition technically good; normal left ventricular size; surface contaminant noted. Stress Images:  There is decreased uptake in the inferoseptal wall. Rest Images:  There is decreased uptake in the inferoseptal wall, less prominent compared to the stress images. Subtraction (SDS):  These findings are consistent with inferoseptal thinning and mild ischemia. Transient Ischemic Dilatation (Normal <1.22):  .99 Lung/Heart Ratio (Normal <0.45):  .35  Quantitative Gated Spect Images QGS EDV:  92 ml QGS ESV:  27 ml QGS cine images:  NL LV Function; NL Wall Motion QGS EF: 70%  Impression Exercise Capacity:  Lexiscan with low level exercise. BP Response:  Normal blood pressure response. Clinical Symptoms:  No chest pain. ECG Impression:  Significant ST abnormalities consistent with ischemia. Comparison with Prior Nuclear Study: Inferoseptal thinning and ischemia new since 10/04/08.  Overall Impression:  Abnormal stress nuclear study with inferoseptal thinning and very mild ischemia; overall felt to represent a low risk study.   Olga Millers

## 2011-06-09 ENCOUNTER — Telehealth: Payer: Self-pay | Admitting: Cardiology

## 2011-06-09 NOTE — Progress Notes (Signed)
nuc med report routed to Dr. Jens Som 06/09/11 Sean Lin

## 2011-06-09 NOTE — Telephone Encounter (Signed)
Spoke with pt, aware of echo and stress results. Carotid not available yet Sean Lin

## 2011-06-09 NOTE — Progress Notes (Signed)
pt aware of results Adena Sima  

## 2011-06-09 NOTE — Telephone Encounter (Signed)
Per pt wife call, pt said someone called yesterday and pt wife was returning call. Pt wife said pt had stress test, ultrasound on heart and arteries on his neck all done yesterday. Please return pt wife call to advise/discuss.

## 2011-06-10 ENCOUNTER — Encounter: Payer: Self-pay | Admitting: Cardiology

## 2011-07-09 ENCOUNTER — Ambulatory Visit: Payer: Medicare Other | Admitting: Cardiology

## 2011-07-09 ENCOUNTER — Other Ambulatory Visit: Payer: Medicare Other | Admitting: *Deleted

## 2011-07-26 ENCOUNTER — Ambulatory Visit (INDEPENDENT_AMBULATORY_CARE_PROVIDER_SITE_OTHER): Payer: Medicare Other | Admitting: *Deleted

## 2011-07-26 ENCOUNTER — Ambulatory Visit (INDEPENDENT_AMBULATORY_CARE_PROVIDER_SITE_OTHER): Payer: Medicare Other | Admitting: Cardiology

## 2011-07-26 ENCOUNTER — Encounter: Payer: Self-pay | Admitting: Cardiology

## 2011-07-26 VITALS — BP 140/72 | HR 100 | Ht 69.0 in | Wt 201.0 lb

## 2011-07-26 DIAGNOSIS — E785 Hyperlipidemia, unspecified: Secondary | ICD-10-CM

## 2011-07-26 DIAGNOSIS — I10 Essential (primary) hypertension: Secondary | ICD-10-CM

## 2011-07-26 DIAGNOSIS — Z8679 Personal history of other diseases of the circulatory system: Secondary | ICD-10-CM

## 2011-07-26 DIAGNOSIS — R0609 Other forms of dyspnea: Secondary | ICD-10-CM

## 2011-07-26 DIAGNOSIS — R0600 Dyspnea, unspecified: Secondary | ICD-10-CM

## 2011-07-26 DIAGNOSIS — I25810 Atherosclerosis of coronary artery bypass graft(s) without angina pectoris: Secondary | ICD-10-CM

## 2011-07-26 LAB — LIPID PANEL
HDL: 38 mg/dL — ABNORMAL LOW (ref >39.00)
Total CHOL/HDL Ratio: 4
VLDL: 26.8 mg/dL (ref 0.0–40.0)

## 2011-07-26 NOTE — Assessment & Plan Note (Signed)
Symptoms with some improvement. Continue present dose of Lasix. Will check chest x-ray.

## 2011-07-26 NOTE — Progress Notes (Signed)
[id number] gentleman in for followup of coronary artery disease. Patient underwent coronary artery bypass graft in 1998. His LV function has been preserved. Last Myoview in August of 2012 showed EF 70, inferoseptal thinning and mild ischemia. Medical therapy recommended. Echo in August of 2012 showed EF 60-65, grade 1 diastolic dysfunction, mild LAE and mildly elevated pulmonary pressures. He also has a history of cerebrovascular disease. Carotid dopplers in August of 2012 showed 40-59 bilateral stenosis and fu recommended in one year. We added Lasix 20 mg daily when I saw him previously. Since then, he has had some improvement in his dyspnea on exertion. His pedal edema has resolved. No orthopnea, PND or chest pain. He does state that he has some dyspnea with lying on his right side.   Current Outpatient Prescriptions  Medication Sig Dispense Refill  . albuterol (VENTOLIN HFA) 108 (90 BASE) MCG/ACT inhaler Inhale 2 puffs into the lungs 2 (two) times daily.        Marland Kitchen aspirin EC 325 MG tablet Take 325 mg by mouth daily.        . beclomethasone (QVAR) 80 MCG/ACT inhaler Inhale 2 puffs into the lungs 2 (two) times daily.        . felodipine (PLENDIL) 10 MG 24 hr tablet Take 10 mg by mouth daily.        . furosemide (LASIX) 20 MG tablet Take 1 tablet (20 mg total) by mouth daily.  30 tablet  11  . losartan-hydrochlorothiazide (HYZAAR) 100-12.5 MG per tablet Take 1 tablet by mouth daily.        . Multiple Vitamins-Minerals (MULTIVITAL) tablet Take 1 tablet by mouth daily.        Marland Kitchen omeprazole (PRILOSEC OTC) 20 MG tablet Take 20 mg by mouth daily.        . pravastatin (PRAVACHOL) 80 MG tablet Take 1 tablet (80 mg total) by mouth every evening.  30 tablet  11  . traMADol (ULTRAM) 50 MG tablet Take 50 mg by mouth every 6 (six) hours as needed.        . vitamin C (ASCORBIC ACID) 500 MG tablet Take 500 mg by mouth daily.           Past Medical History  Diagnosis Date  . CAD (coronary artery disease)    A. 03/21/97: s/p CABG x4 (pvt) - LIMA - LAD; SVG -DIAG; SVG-OM; SVG-RCA B. 10/04/2008: EX. MV - EX. TIME 5:15; MAX HR 155; EF 65%; LOW RISK STUDY WITH NL     . Depression   . HTN (hypertension)   . HLD (hyperlipidemia)   . Cerebrovascular disease   . Prostate cancer     hx  . [place] hemorrhagic fever     pending r tha ??    Past Surgical History  Procedure Date  . Coronary artery bypass graft     1998    History   Social History  . Marital Status: Married    Spouse Name: N/A    Number of Children: N/A  . Years of Education: N/A   Occupational History  . Not on file.   Social History Main Topics  . Smoking status: Former Games developer  . Smokeless tobacco: Not on file   Comment: quit in 1989. Smoked for 40 years, up to two packs a day   . Alcohol Use:   . Drug Use:   . Sexually Active:    Other Topics Concern  . Not on file   Social History Narrative  .  No narrative on file    ROS: no fevers or chills, productive cough, hemoptysis, dysphasia, odynophagia, melena, hematochezia, dysuria, hematuria, rash, seizure activity, orthopnea, PND, pedal edema, claudication. Remaining systems are negative.  Physical Exam: Well-developed well-nourished in no acute distress.  Skin is warm and dry.  HEENT is normal.  Neck is supple. No thyromegaly.  Chest is  mild expiratory wheeze Cardiovascular exam is regular rate and rhythm.  Abdominal exam nontender or distended. No masses palpated. Extremities show no edema. neuro grossly intact

## 2011-07-26 NOTE — Assessment & Plan Note (Signed)
Continue present blood pressure medications. 

## 2011-07-26 NOTE — Patient Instructions (Addendum)
Your physician wants you to follow-up in: 6 months  You will receive a reminder letter in the mail two months in advance. If you don't receive a letter, please call our office to schedule the follow-up appointment.   A chest x-ray takes a picture of the organs and structures inside the chest, including the heart, lungs, and blood vessels. This test can show several things, including, whether the heart is enlarges; whether fluid is building up in the lungs; and whether pacemaker / defibrillator leads are still in place. AT [address]

## 2011-07-26 NOTE — Assessment & Plan Note (Signed)
Myoview low risk. Continue aspirin, statin and risk factor modification.

## 2011-07-26 NOTE — Assessment & Plan Note (Signed)
Continue statin. Check lipids and liver. 

## 2011-07-26 NOTE — Assessment & Plan Note (Signed)
Continue aspirin and statin. Followup carotid Dopplers August 2013. 

## 2011-07-27 ENCOUNTER — Encounter: Payer: Self-pay | Admitting: *Deleted

## 2011-09-06 ENCOUNTER — Encounter: Payer: Self-pay | Admitting: Emergency Medicine

## 2011-09-06 ENCOUNTER — Ambulatory Visit (INDEPENDENT_AMBULATORY_CARE_PROVIDER_SITE_OTHER): Payer: Medicare Other | Admitting: Emergency Medicine

## 2011-09-06 ENCOUNTER — Ambulatory Visit (INDEPENDENT_AMBULATORY_CARE_PROVIDER_SITE_OTHER)
Admission: RE | Admit: 2011-09-06 | Discharge: 2011-09-06 | Disposition: A | Discharge status: Home / Self Care | Payer: Medicare Other | Source: Ambulatory Visit | Attending: Cardiology | Admitting: Cardiology

## 2011-09-06 DIAGNOSIS — R0600 Dyspnea, unspecified: Secondary | ICD-10-CM

## 2011-09-06 DIAGNOSIS — R0609 Other forms of dyspnea: Secondary | ICD-10-CM

## 2011-09-06 DIAGNOSIS — R0989 Other specified symptoms and signs involving the circulatory and respiratory systems: Secondary | ICD-10-CM

## 2011-09-06 DIAGNOSIS — J45909 Unspecified asthma, uncomplicated: Secondary | ICD-10-CM

## 2011-09-06 NOTE — Progress Notes (Signed)
Subjective:    Patient ID: Sean Lin, male    DOB: Dec 25, 1934, 76 y.o.   MRN: [id number]  HPI 76 yo man, hx former tobacco (8 pk-yrs), CAD/CABG '91, HTN, DM, prostate CA. Tells me he was dx with asthma about 15 yrs ago, has been on Advair in past, also Maxair. Currently on QVAR + albuterol. Was dx with CAP in [place] approximately about a month ago. Started as URI symptoms that evolved to sinus drainage and then to chest congestion, cough.  CXR was performed that showed possible PNA, treated with Avelox + Pred.  Course c/b diarrhea.   In retrospect he feels he had some dyspnea even before the PNA. He is about back to prior baseline. This would have been his first real exacerbation.     Review of Systems  Constitutional: Negative.  Negative for fever and unexpected weight change.  HENT: Negative.  Negative for ear pain, nosebleeds, congestion, sore throat, rhinorrhea, sneezing, trouble swallowing, dental problem, postnasal drip and sinus pressure.   Eyes: Negative.  Negative for redness and itching.  Respiratory: Positive for shortness of breath. Negative for cough, chest tightness and wheezing.   Cardiovascular: Positive for leg swelling. Negative for palpitations.  Gastrointestinal: Negative.  Negative for nausea and vomiting.  Genitourinary: Negative.  Negative for dysuria.  Musculoskeletal: Positive for joint swelling.  Skin: Negative for rash.  Neurological: Negative.  Negative for headaches.  Hematological: Negative.  Does not bruise/bleed easily.  Psychiatric/Behavioral: Negative.  Negative for dysphoric mood. The patient is not nervous/anxious.     Past Medical History  Diagnosis Date  . CAD (coronary artery disease)     A. 03/21/97: s/p CABG x4 (pvt) - LIMA - LAD; SVG -DIAG; SVG-OM; SVG-RCA B. 10/04/2008: EX. MV - EX. TIME 5:15; MAX HR 155; EF 65%; LOW RISK STUDY WITH NL     . Depression   . HTN (hypertension)   . HLD (hyperlipidemia)   . Cerebrovascular disease   .  Prostate cancer     hx  . [place] hemorrhagic fever     pending r tha ??     Family History  Problem Relation Age of Onset  . Arthritis Mother   . Kidney cancer Father   . Lymphoma Son     Non-Hodgkin's     History   Social History  . Marital Status: Married    Spouse Name: N/A    Number of Children: N/A  . Years of Education: N/A   Occupational History  . 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 Smoking status: Former Smoker -- 0.5 packs/day for 15 years    Types: Cigarettes    Quit date: 11/02/1983  . Smokeless tobacco: Not on file   Comment: quit in 1989. Smoked for 40 years, up to two packs a day   . Alcohol Use: No  . Drug Use: No  . Sexually Active: Not on file   Other Topics Concern  . Not on file   Social History Narrative  . No narrative on file     Allergies  Allergen Reactions  . Guaifenesin     REACTION: nausea  . Hydrocodone     REACTION: nausea  . Levofloxacin     REACTION: nausea     Outpatient Prescriptions Prior to Visit  Medication Sig Dispense Refill  . albuterol (VENTOLIN HFA) 108 (90 BASE) MCG/ACT inhaler Inhale 2 puffs into the lungs 2 (two) times daily.        Marland Kitchen  aspirin EC 325 MG tablet Take 325 mg by mouth daily.        . beclomethasone (QVAR) 80 MCG/ACT inhaler Inhale 2 puffs into the lungs 2 (two) times daily.        . felodipine (PLENDIL) 10 MG 24 hr tablet Take 10 mg by mouth daily.        . furosemide (LASIX) 20 MG tablet Take 1 tablet (20 mg total) by mouth daily.  30 tablet  11  . losartan-hydrochlorothiazide (HYZAAR) 100-12.5 MG per tablet Take 1 tablet by mouth daily.        . Multiple Vitamins-Minerals (MULTIVITAL) tablet Take 1 tablet by mouth daily.        Marland Kitchen omeprazole (PRILOSEC OTC) 20 MG tablet Take 20 mg by mouth daily.        . pravastatin (PRAVACHOL) 80 MG tablet Take 1 tablet (80 mg total) by mouth every evening.  30 tablet  11  . traMADol (ULTRAM) 50 MG tablet Take 50 mg by mouth every 6 (six)  hours as needed.        . vitamin C (ASCORBIC ACID) 500 MG tablet Take 500 mg by mouth daily.              Objective:   Physical Exam  Gen: Pleasant, overwt man, in no distress,  normal affect  ENT: No lesions,  mouth clear,  oropharynx clear, no postnasal drip, some hoarse voice  Neck: No JVD, no TMG, no carotid bruits  Lungs: No use of accessory muscles, distant, few scattered crackles, no wheezes.   Cardiovascular: RRR, heart sounds normal, no murmur or gallops, no peripheral edema  Musculoskeletal: No deformities, no cyanosis or clubbing  Neuro: alert, non focal  Skin: Warm, no lesions or rashes     Assessment & Plan:  Dyspnea Recent eval by Dr Jens Som - no evidence for limiting CAD Suspect this could reflect asthma/mild COPD.  - walking oximetry  Asthma, extrinsic, without status asthmaticus CXR was done Urgent Care in [place] - repeat CXR today - full PFT - QVAR bid for now - change albuterol to prn

## 2011-09-06 NOTE — Patient Instructions (Signed)
Full Pulmonary Function Testing at your next office visit CXR today Walking oximetry today Continue to use your QVAR 2 puffs twice a day Change your albuterol to use it only 2 puffs as needed for shortness of breath.  Follow with Dr Delton Coombes next available

## 2011-09-06 NOTE — Assessment & Plan Note (Signed)
CXR was done Urgent Care in [place] - repeat CXR today - full PFT - QVAR bid for now - change albuterol to prn

## 2011-09-06 NOTE — Assessment & Plan Note (Signed)
Recent eval by Dr Jens Som - no evidence for limiting CAD Suspect this could reflect asthma/mild COPD.  - walking oximetry

## 2011-10-11 ENCOUNTER — Encounter: Payer: Self-pay | Admitting: Emergency Medicine

## 2011-10-11 ENCOUNTER — Ambulatory Visit (INDEPENDENT_AMBULATORY_CARE_PROVIDER_SITE_OTHER): Payer: Medicare Other | Admitting: Emergency Medicine

## 2011-10-11 VITALS — BP 118/72 | HR 73 | Temp 98.4°F | Ht 69.0 in | Wt 197.0 lb

## 2011-10-11 DIAGNOSIS — R0609 Other forms of dyspnea: Secondary | ICD-10-CM

## 2011-10-11 DIAGNOSIS — J45909 Unspecified asthma, uncomplicated: Secondary | ICD-10-CM

## 2011-10-11 DIAGNOSIS — R0600 Dyspnea, unspecified: Secondary | ICD-10-CM

## 2011-10-11 LAB — PULMONARY FUNCTION TEST

## 2011-10-11 NOTE — Progress Notes (Signed)
PFT done today. 

## 2011-10-11 NOTE — Assessment & Plan Note (Signed)
-   trial changing QVAR to Symbicort 160 bid x 3 weeks to see if there is improvement - SABA prn - rov in 3 -4 weeks to assess progress

## 2011-10-11 NOTE — Progress Notes (Signed)
  Subjective:    Patient ID: Sean Lin, male    DOB: 11/17/1934, 76 y.o.   MRN: [id number]  HPI 76 yo man, hx former tobacco (8 pk-yrs), CAD/CABG '91, HTN, DM, prostate CA. Tells me he was dx with asthma about 15 yrs ago, has been on Advair in past, also Maxair. Currently on QVAR + albuterol. Was dx with CAP in [place] approximately about a month ago. Started as URI symptoms that evolved to sinus drainage and then to chest congestion, cough.  CXR was performed that showed possible PNA, treated with Avelox + Pred.  Course c/b diarrhea.   In retrospect he feels he had some dyspnea even before the PNA. He is about back to prior baseline. This would have been his first real exacerbation.    ROV 10/11/11 -- returns f/u dyspnea and suspected asthma/COPD. PFTs done 12/10 show moderately severe AFL with positive BD response. He is better than last time - is getting over the PNA, finished pred and avelox.  He is using SABA most days, but not all. Sometimes goes a week without it.   PULMONARY FUNCTON TEST 10/11/2011  FVC 3.21  FEV1 1.28  FEV1/FVC 39.9  FVC  % Predicted 54  FEV % Predicted 47  FeF 25-75 0.54  FeF 25-75 % Predicted 2.43       Objective:   Physical Exam  Gen: Pleasant, overwt man, in no distress,  normal affect  ENT: No lesions,  mouth clear,  oropharynx clear, no postnasal drip, some hoarse voice  Neck: No JVD, no TMG, no carotid bruits  Lungs: No use of accessory muscles, distant, few scattered crackles, no wheezes.   Cardiovascular: RRR, heart sounds normal, no murmur or gallops, no peripheral edema  Musculoskeletal: No deformities, no cyanosis or clubbing  Neuro: alert, non focal  Skin: Warm, no lesions or rashes     Assessment & Plan:  Asthma, extrinsic, without status asthmaticus - trial changing QVAR to Symbicort 160 bid x 3 weeks to see if there is improvement - SABA prn - rov in 3 -4 weeks to assess progress

## 2011-10-11 NOTE — Patient Instructions (Signed)
Stop QVAR for now, and start Symbicort 160/4.31mcg 2 puffs twice a day.  Use albuterol (rescue) inhaler as needed Follow with Dr Delton Coombes in 3 -4 weeks to assess your progress

## 2011-10-18 ENCOUNTER — Encounter: Payer: Self-pay | Admitting: Emergency Medicine

## 2011-11-05 ENCOUNTER — Encounter: Payer: Self-pay | Admitting: Emergency Medicine

## 2011-11-05 ENCOUNTER — Ambulatory Visit (INDEPENDENT_AMBULATORY_CARE_PROVIDER_SITE_OTHER): Payer: Medicare Other | Admitting: Emergency Medicine

## 2011-11-05 VITALS — BP 120/80 | HR 69 | Temp 98.6°F | Ht 69.0 in | Wt 195.8 lb

## 2011-11-05 DIAGNOSIS — J45909 Unspecified asthma, uncomplicated: Secondary | ICD-10-CM

## 2011-11-05 NOTE — Patient Instructions (Signed)
Please continue your Symbicort 2 puffs twice a day Use your albuterol 2 puffs if needed Follow with Dr Delton Coombes in 6 months or sooner if you have any problems.

## 2011-11-05 NOTE — Progress Notes (Signed)
  Subjective:    Patient ID: Sean Lin, male    DOB: [birth year], 77 y.o.   MRN: [id number]  HPI 77 yo man, hx former tobacco (8 pk-yrs), CAD/CABG '91, HTN, DM, prostate CA. Tells me he was dx with asthma about 15 yrs ago, has been on Advair in past, also Maxair. Currently on QVAR + albuterol. Was dx with CAP in [place] approximately about a month ago. Started as URI symptoms that evolved to sinus drainage and then to chest congestion, cough.  CXR was performed that showed possible PNA, treated with Avelox + Pred.  Course c/b diarrhea.   In retrospect he feels he had some dyspnea even before the PNA. He is about back to prior baseline. This would have been his first real exacerbation.    ROV 10/11/11 -- returns f/u dyspnea and suspected asthma/COPD. PFTs done 12/10 show moderately severe AFL with positive BD response. He is better than last time - is getting over the PNA, finished pred and avelox.  He is using SABA most days, but not all. Sometimes goes a week without it.   PULMONARY FUNCTON TEST 10/11/2011  FVC 3.21  FEV1 1.28  FEV1/FVC 39.9  FVC  % Predicted 54  FEV % Predicted 47  FeF 25-75 0.54  FeF 25-75 % Predicted 2.43   ROV 11/05/11 -- follows up for his asthma. Last time we changed QVAR to Symbicort to see if he would benefit. He is better, has only needed SABA once since last time. No side effects, no problems tolerating. He wants to stay on it.       Objective:   Physical Exam  Gen: Pleasant, overwt man, in no distress,  normal affect  ENT: No lesions,  mouth clear,  oropharynx clear, no postnasal drip, some hoarse voice  Neck: No JVD, no TMG, no carotid bruits  Lungs: No use of accessory muscles, distant, few scattered crackles, no wheezes.   Cardiovascular: RRR, heart sounds normal, no murmur or gallops, no peripheral edema  Musculoskeletal: No deformities, no cyanosis or clubbing  Neuro: alert, non focal  Skin: Warm, no lesions or rashes     Assessment &  Plan:  Asthma, extrinsic, without status asthmaticus Improved on Symbicort, will continue same SABA prn/.

## 2011-11-05 NOTE — Assessment & Plan Note (Signed)
Improved on Symbicort, will continue same SABA prn/.

## 2011-12-01 ENCOUNTER — Telehealth: Payer: Self-pay | Admitting: Emergency Medicine

## 2011-12-01 MED ORDER — BUDESONIDE-FORMOTEROL FUMARATE 160-4.5 MCG/ACT IN AERO: 2.0000 | INHALATION_SPRAY | Freq: Two times a day (BID) | RESPIRATORY_TRACT | Status: DC

## 2011-12-01 NOTE — Telephone Encounter (Signed)
Rx has been sent and pt is aware. Nothing further was needed 

## 2012-02-14 ENCOUNTER — Telehealth: Payer: Self-pay | Admitting: Emergency Medicine

## 2012-02-14 ENCOUNTER — Ambulatory Visit: Payer: Medicare Other | Admitting: Adult Health

## 2012-02-14 NOTE — Telephone Encounter (Signed)
Error.Sean Lin ° °

## 2012-02-17 ENCOUNTER — Encounter: Payer: Self-pay | Admitting: Adult Health

## 2012-02-17 ENCOUNTER — Other Ambulatory Visit (INDEPENDENT_AMBULATORY_CARE_PROVIDER_SITE_OTHER): Payer: Medicare Other

## 2012-02-17 ENCOUNTER — Ambulatory Visit (INDEPENDENT_AMBULATORY_CARE_PROVIDER_SITE_OTHER)
Admission: RE | Admit: 2012-02-17 | Discharge: 2012-02-17 | Disposition: A | Discharge status: Home / Self Care | Payer: Medicare Other | Source: Ambulatory Visit | Attending: Adult Health | Admitting: Adult Health

## 2012-02-17 ENCOUNTER — Ambulatory Visit (INDEPENDENT_AMBULATORY_CARE_PROVIDER_SITE_OTHER): Payer: Medicare Other | Admitting: Adult Health

## 2012-02-17 VITALS — BP 138/68 | HR 69 | Temp 98.0°F | Ht 69.5 in | Wt 197.6 lb

## 2012-02-17 DIAGNOSIS — J45909 Unspecified asthma, uncomplicated: Secondary | ICD-10-CM

## 2012-02-17 DIAGNOSIS — R0600 Dyspnea, unspecified: Secondary | ICD-10-CM

## 2012-02-17 DIAGNOSIS — R0609 Other forms of dyspnea: Secondary | ICD-10-CM

## 2012-02-17 DIAGNOSIS — R0989 Other specified symptoms and signs involving the circulatory and respiratory systems: Secondary | ICD-10-CM

## 2012-02-17 LAB — BASIC METABOLIC PANEL
BUN: 19 mg/dL (ref 6–23)
GFR: 66.52 mL/min (ref >60.00)
Potassium: 4.2 mEq/L (ref 3.5–5.1)
Sodium: 137 mEq/L (ref 135–145)

## 2012-02-17 MED ORDER — MOXIFLOXACIN HCL 400 MG PO TABS: 400.0000 mg | ORAL_TABLET | Freq: Every day | ORAL | Status: DC

## 2012-02-17 MED ORDER — LEVALBUTEROL HCL 0.63 MG/3ML IN NEBU
0.6300 mg | INHALATION_SOLUTION | Freq: Once | RESPIRATORY_TRACT | Status: AC
  Administered 2012-02-17: 0.63 mg via RESPIRATORY_TRACT

## 2012-02-17 NOTE — Progress Notes (Signed)
Subjective:    Patient ID: Sean Lin, male    DOB: [birth year], 77 y.o.   MRN: [id number]  HPI 77 yo man, hx former tobacco (8 pk-yrs), CAD/CABG '91, HTN, DM, prostate CA. Tells me he was dx with asthma about 15 yrs ago, has been on Advair in past, also Maxair. Currently on QVAR + albuterol. Was dx with CAP in [place] approximately about a month ago. Started as URI symptoms that evolved to sinus drainage and then to chest congestion, cough.  CXR was performed that showed possible PNA, treated with Avelox + Pred.  Course c/b diarrhea.   In retrospect he feels he had some dyspnea even before the PNA. He is about back to prior baseline. This would have been his first real exacerbation.    ROV 10/11/11 -- returns f/u dyspnea and suspected asthma/COPD. PFTs done 12/10 show moderately severe AFL with positive BD response. He is better than last time - is getting over the PNA, finished pred and avelox.  He is using SABA most days, but not all. Sometimes goes a week without it.   PULMONARY FUNCTON TEST 10/11/2011  FVC 3.21  FEV1 1.28  FEV1/FVC 39.9  FVC  % Predicted 54  FEV % Predicted 47  FeF 25-75 0.54  FeF 25-75 % Predicted 2.43   ROV 11/05/11 -- follows up for his asthma. Last time we changed QVAR to Symbicort to see if he would benefit. He is better, has only needed SABA once since last time. No side effects, no problems tolerating. He wants to stay on it.   02/17/2012 Acute OV  Complains of prod cough with gray mucus, wheezing, increased SOB x1 month. Feels he is getting worse. Seen by  PCP initially  at onset of symptoms and was given azithromycin x7days, pred taper and mucinex. But no better. He denies any weight loss, hemoptysis, chest pain, or edema. Feels that he has a lot of congestion, especially on the right side, but very difficult to get up.    ROS:  Constitutional:   No  weight loss, night sweats,  Fevers, chills,  +fatigue, or  lassitude.  HEENT:   No headaches,  Difficulty  swallowing,  Tooth/dental problems, or  Sore throat,                No sneezing, itching, ear ache, nasal congestion, post nasal drip,   CV:  No chest pain,  Orthopnea, PND, swelling in lower extremities, anasarca, dizziness, palpitations, syncope.   GI  No heartburn, indigestion, abdominal pain, nausea, vomiting, diarrhea, change in bowel habits, loss of appetite, bloody stools.   Resp:    No coughing up of blood.    No chest wall deformity  Skin: no rash or lesions.  GU: no dysuria, change in color of urine, no urgency or frequency.  No flank pain, no hematuria   MS:  No joint pain or swelling.  No decreased range of motion.  No back pain.  Psych:  No change in mood or affect. No depression or anxiety.  No memory loss.         Objective:   Physical Exam  Gen: Pleasant, overwt man, in no distress,  normal affect  ENT: No lesions,  mouth clear,  oropharynx clear, no postnasal drip, some hoarse voice  Neck: No JVD, no TMG, no carotid bruits  Lungs: No use of accessory muscles, distant, few scattered crackles, no wheezes.   Cardiovascular: RRR, heart sounds normal, no murmur or gallops, no  peripheral edema  Musculoskeletal: No deformities, no cyanosis or clubbing  Neuro: alert, non focal  Skin: Warm, no lesions or rashes   CXR :  Elevation right hemidiaphragm with basilar atelectasis on the  right. The right hemidiaphragm appears to be more elevated than on  previous study. Diaphragmatic elevation on the right could be  associated with atelectasis alone, but paresis cannot be excluded.  Endobronchial lesion cannot be excluded. Diaphragmatic motion  could be observed at fluoroscopy if clinically indicated. History  given of wheezing, coughing, and congestion. Bronchoscopy or CT of  the chest may be considered.     Assessment & Plan:

## 2012-02-17 NOTE — Patient Instructions (Signed)
Begin Avelox 400mg  daily for 7 days  Fluitter valve Three times a day   Continue on Symbiocrt 2 puffs Twice daily   Albuterol Inhaler As needed   May use Robitussium As needed  For cough/congestion  Eat yogurt while on antibiotic.  We are setting you up for a CT scan of your chest.  follow up in 2 weeks Dr. Delton Coombes  Or Mihail Prettyman

## 2012-02-17 NOTE — Assessment & Plan Note (Signed)
Exacerbation  CXR with abnormal Right side hemidiaphram elevation  After review of cxr appears to have occurred after his CABG on serial film review.  Will check Ct chest pending those results, suspect will need SNIFF test   Plan:  Begin Avelox 400mg  daily for 7 days  Fluitter valve Three times a day   Continue on Symbiocrt 2 puffs Twice daily   Albuterol Inhaler As needed   May use Robitussium As needed  For cough/congestion  Eat yogurt while on antibiotic.  We are setting you up for a CT scan of your chest.  follow up in 2 weeks Dr. Delton Coombes  Or Kristian Hazzard

## 2012-02-18 ENCOUNTER — Ambulatory Visit (INDEPENDENT_AMBULATORY_CARE_PROVIDER_SITE_OTHER)
Admission: RE | Admit: 2012-02-18 | Discharge: 2012-02-18 | Disposition: A | Discharge status: Home / Self Care | Payer: Medicare Other | Source: Ambulatory Visit | Attending: Cardiology | Admitting: Cardiology

## 2012-02-18 ENCOUNTER — Other Ambulatory Visit: Payer: Self-pay | Admitting: Adult Health

## 2012-02-18 DIAGNOSIS — R0609 Other forms of dyspnea: Secondary | ICD-10-CM

## 2012-02-18 DIAGNOSIS — R0600 Dyspnea, unspecified: Secondary | ICD-10-CM

## 2012-02-18 DIAGNOSIS — R0989 Other specified symptoms and signs involving the circulatory and respiratory systems: Secondary | ICD-10-CM

## 2012-02-18 MED ORDER — IOHEXOL 300 MG/ML  SOLN
80.0000 mL | Freq: Once | INTRAMUSCULAR | Status: AC | PRN
  Administered 2012-02-18: 80 mL via INTRAVENOUS

## 2012-02-18 NOTE — Progress Notes (Signed)
Per 4.19.13 CT result note, sniff test ordered.  Pt and wife are aware.

## 2012-02-21 ENCOUNTER — Ambulatory Visit (HOSPITAL_COMMUNITY)
Admission: RE | Admit: 2012-02-21 | Discharge: 2012-02-21 | Disposition: A | Discharge status: Home / Self Care | Payer: Medicare Other | Source: Ambulatory Visit | Attending: Adult Health | Admitting: Adult Health

## 2012-02-21 DIAGNOSIS — J986 Disorders of diaphragm: Secondary | ICD-10-CM | POA: Insufficient documentation

## 2012-02-21 DIAGNOSIS — R0600 Dyspnea, unspecified: Secondary | ICD-10-CM

## 2012-02-21 DIAGNOSIS — R0602 Shortness of breath: Secondary | ICD-10-CM | POA: Insufficient documentation

## 2012-02-22 ENCOUNTER — Encounter: Payer: Self-pay | Admitting: Adult Health

## 2012-02-22 DIAGNOSIS — J986 Disorders of diaphragm: Secondary | ICD-10-CM | POA: Insufficient documentation

## 2012-02-22 DIAGNOSIS — R918 Other nonspecific abnormal finding of lung field: Secondary | ICD-10-CM | POA: Insufficient documentation

## 2012-02-22 MED ORDER — FLUTTER DEVI: Status: DC

## 2012-02-22 NOTE — Progress Notes (Signed)
Addended by: Boone Master E on: 02/22/2012 10:37 AM   Modules accepted: Orders

## 2012-02-24 ENCOUNTER — Ambulatory Visit (INDEPENDENT_AMBULATORY_CARE_PROVIDER_SITE_OTHER): Payer: Medicare Other | Admitting: Emergency Medicine

## 2012-02-24 ENCOUNTER — Encounter: Payer: Self-pay | Admitting: Emergency Medicine

## 2012-02-24 VITALS — BP 140/62 | HR 71 | Temp 98.5°F | Ht 69.0 in | Wt 200.2 lb

## 2012-02-24 DIAGNOSIS — R918 Other nonspecific abnormal finding of lung field: Secondary | ICD-10-CM

## 2012-02-24 DIAGNOSIS — J45909 Unspecified asthma, uncomplicated: Secondary | ICD-10-CM

## 2012-02-24 DIAGNOSIS — J986 Disorders of diaphragm: Secondary | ICD-10-CM

## 2012-02-24 NOTE — Assessment & Plan Note (Signed)
Acute flare and bronchitis improved after rx w avelox - continue symbicort + SABA prn - rov 4 mo

## 2012-02-24 NOTE — Progress Notes (Signed)
Subjective:    Patient ID: Sean Lin, male    DOB: 11-18-1934, 77 y.o.   MRN: [id number] HPI 77 yo man, hx former tobacco (8 pk-yrs), CAD/CABG '91, HTN, DM, prostate CA. Tells me he was dx with asthma about 15 yrs ago, has been on Advair in past, also Maxair. Currently on QVAR + albuterol. Was dx with CAP in [place] approximately about a month ago. Started as URI symptoms that evolved to sinus drainage and then to chest congestion, cough.  CXR was performed that showed possible PNA, treated with Avelox + Pred.  Course c/b diarrhea.   In retrospect he feels he had some dyspnea even before the PNA. He is about back to prior baseline. This would have been his first real exacerbation.    ROV 10/11/11 -- returns f/u dyspnea and suspected asthma/COPD. PFTs done 12/10 show moderately severe AFL with positive BD response. He is better than last time - is getting over the PNA, finished pred and avelox.  He is using SABA most days, but not all. Sometimes goes a week without it.   PULMONARY FUNCTON TEST 10/11/2011  FVC 3.21  FEV1 1.28  FEV1/FVC 39.9  FVC  % Predicted 54  FEV % Predicted 47  FeF 25-75 0.54  FeF 25-75 % Predicted 2.43   ROV 11/05/11 -- follows up for his asthma. Last time we changed QVAR to Symbicort to see if he would benefit. He is better, has only needed SABA once since last time. No side effects, no problems tolerating. He wants to stay on it.   02/17/2012 Acute OV  Complains of prod cough with gray mucus, wheezing, increased SOB x1 month. Feels he is getting worse. Seen by  PCP initially  at onset of symptoms and was given azithromycin x7days, pred taper and mucinex. But no better. He denies any weight loss, hemoptysis, chest pain, or edema. Feels that he has a lot of congestion, especially on the right side, but very difficult to get up.   ROV 02/23/12 -- Hx former tobacco (8 pk-yrs), CAD/CABG '91, HTN, DM, prostate CA. 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 Seen 4/18  with bronchitic sx and possible AE of his asthma. Was having cough productive of green sputum, was seen by PCP twice prior to being seen and had pred + azithro. Got avelox from [facility] last week. Eval revealed persistent R HD elevation prompting CT scan as below. 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      Objective:   Physical Exam  Gen: Pleasant, overwt man, in no distress,  normal affect  ENT: No lesions,  mouth clear,  oropharynx clear, no postnasal drip, some hoarse voice  Neck: No JVD, no TMG, no carotid bruits  Lungs: No use of accessory muscles, distant, few scattered crackles, no wheezes.   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  No mediastinal or hilar adenopathy present.  Prior median sternotomy and CABG procedure.  There are multiple pulmonary nodules identified within the right  lung. Right upper lobe nodule measures 5.2 mm, image 16. Within  the right lower lobe there is a  nodule measuring 10 mm. Within the  right base there is an irregular shaped subpleural nodule measuring  0.8 x 1.4 cm.  Although less in number, there are a few nodules identified within  the left lung. Within the left lower lobe there is a small nodule  measuring 3.3 mm. No discrete pulmonary mass identified.  Degenerative disc disease is noted within the lower thoracic spine.  Limited imaging through the upper abdomen is negative.  IMPRESSION:  1. Asymmetric elevation of the right hemidiaphragm is of uncertain  etiology. This may reflect diaphragmatic paralysis which could be  confirmed with a  "sniff test"  2. Pulmonary nodules are noted in both lungs. These have a  nonspecific appearance. These may be the sequela of inflammation  or infection. Pulmonary metastatic disease is less favored but not  excluded.      Assessment & Plan:    Asthma, extrinsic, without status asthmaticus Acute flare and bronchitis improved after rx w avelox - continue symbicort + SABA prn - rov 4 mo  Diaphragm paralysis Reviewed data with him, do not believe there is any intervention to make. May have been slightly elevated as far back as 2007, but not to the degree that it is now. ? Whether related to CABG?   Multiple lung nodules Will need repeat CT scan in 6 months

## 2012-02-24 NOTE — Patient Instructions (Signed)
Please continue your Symbicort  Use your albuterol as needed Follow with Dr Delton Coombes in 4 months or sooner if you have any problems. We will repeat your CT scan in 6 -12 months to insure that it is not changing.

## 2012-02-24 NOTE — Assessment & Plan Note (Signed)
Will need repeat CT scan in 6 months

## 2012-02-24 NOTE — Assessment & Plan Note (Signed)
Reviewed data with him, do not believe there is any intervention to make. May have been slightly elevated as far back as 2007, but not to the degree that it is now. ? Whether related to CABG?

## 2012-05-23 ENCOUNTER — Other Ambulatory Visit: Payer: Self-pay | Admitting: Cardiology

## 2012-05-26 ENCOUNTER — Other Ambulatory Visit: Payer: Self-pay

## 2012-05-26 MED ORDER — FUROSEMIDE 20 MG PO TABS: 20.0000 mg | ORAL_TABLET | Freq: Every day | ORAL | Status: DC

## 2012-05-26 NOTE — Telephone Encounter (Signed)
.   Requested Prescriptions   Signed Prescriptions Disp Refills  . furosemide (LASIX) 20 MG tablet 30 tablet 11    Sig: Take 1 tablet (20 mg total) by mouth daily.    Authorizing Provider: Lewayne Bunting    Ordering User: Lacie Scotts

## 2012-07-04 ENCOUNTER — Encounter: Payer: Self-pay | Admitting: Emergency Medicine

## 2012-07-04 ENCOUNTER — Ambulatory Visit (INDEPENDENT_AMBULATORY_CARE_PROVIDER_SITE_OTHER): Payer: Medicare Other | Admitting: Emergency Medicine

## 2012-07-04 VITALS — BP 112/62 | HR 67 | Temp 98.1°F | Ht 69.0 in | Wt 198.0 lb

## 2012-07-04 DIAGNOSIS — R918 Other nonspecific abnormal finding of lung field: Secondary | ICD-10-CM

## 2012-07-04 DIAGNOSIS — J45909 Unspecified asthma, uncomplicated: Secondary | ICD-10-CM

## 2012-07-04 NOTE — Assessment & Plan Note (Signed)
Need to repeat the CT scan of the chest 10/13.

## 2012-07-04 NOTE — Assessment & Plan Note (Addendum)
Has stabilized since recent aspiration event and exacerbation.  - continue Symbicort + atrovent qid - consider change to spiriva if atrovent beneficial - saba prn - rov 3 mon

## 2012-07-04 NOTE — Progress Notes (Signed)
Subjective:    Patient ID: Sean Lin, male    DOB: 1935-06-04, 77 y.o.   MRN: [id number] HPI 77 yo man, hx former tobacco (8 pk-yrs), CAD/CABG '91, HTN, DM, prostate CA. Tells me he was dx with asthma about 15 yrs ago, has been on Advair in past, also Maxair. Currently on QVAR + albuterol. Was dx with CAP in [place] approximately about a month ago. Started as URI symptoms that evolved to sinus drainage and then to chest congestion, cough.  CXR was performed that showed possible PNA, treated with Avelox + Pred.  Course c/b diarrhea.   In retrospect he feels he had some dyspnea even before the PNA. He is about back to prior baseline. This would have been his first real exacerbation.    ROV 10/11/11 -- returns f/u dyspnea and suspected asthma/COPD. PFTs done 12/10 show moderately severe AFL with positive BD response. He is better than last time - is getting over the PNA, finished pred and avelox.  He is using SABA most days, but not all. Sometimes goes a week without it.   PULMONARY FUNCTON TEST 10/11/2011  FVC 3.21  FEV1 1.28  FEV1/FVC 39.9  FVC  % Predicted 54  FEV % Predicted 47  FeF 25-75 0.54  FeF 25-75 % Predicted 2.43   ROV 11/05/11 -- follows up for his asthma. Last time we changed QVAR to Symbicort to see if he would benefit. He is better, has only needed SABA once since last time. No side effects, no problems tolerating. He wants to stay on it.   02/17/2012 Acute OV  Complains of prod cough with gray mucus, wheezing, increased SOB x1 month. Feels he is getting worse. Seen by  PCP initially  at onset of symptoms and was given azithromycin x7days, pred taper and mucinex. But no better. He denies any weight loss, hemoptysis, chest pain, or edema. Feels that he has a lot of congestion, especially on the right side, but very difficult to get up.   ROV 02/23/12 -- Hx former tobacco (8 pk-yrs), CAD/CABG '91, HTN, DM, prostate CA. 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 Seen 4/18  with bronchitic sx and possible AE of his asthma. Was having cough productive of green sputum, was seen by PCP twice prior to being seen and had pred + azithro. Got avelox from [facility] last week. Eval revealed persistent R HD elevation prompting CT scan as below. 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   ROV 07/04/12 -- former tobacco (8 pk-yrs), CAD/CABG '91, HTN, DM, prostate CA. 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 Also R HD paralysis with associated restriction. He was admitted to [facility] 8/13 with an apparent exacerbation  - was started on a new inhaled med + symbicort + saba prn. He describes an episode where he had trouble getting choked on a pill, this led to a decompensation and asthma flare.      Objective:   Physical Exam Filed Vitals:   07/04/12 1021  BP: 112/62  Pulse: 67  Temp: 98.1 F (36.7 C)   Gen: Pleasant, overwt man, in no distress,  normal affect  ENT: No lesions,  mouth clear,  oropharynx clear, no postnasal drip, some hoarse voice  Neck: No JVD, no TMG, no carotid bruits  Lungs: No use of accessory muscles, distant, few scattered crackles, no wheezes. 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  No mediastinal or hilar adenopathy present.  Prior median sternotomy and CABG procedure.  There are multiple pulmonary nodules identified within the right  lung. Right upper lobe nodule measures 5.2 mm, image 16. Within  the right lower lobe there is a nodule measuring 10 mm. Within the  right base there is an irregular  shaped subpleural nodule measuring  0.8 x 1.4 cm.  Although less in number, there are a few nodules identified within  the left lung. Within the left lower lobe there is a small nodule  measuring 3.3 mm. No discrete pulmonary mass identified.  Degenerative disc disease is noted within the lower thoracic spine.  Limited imaging through the upper abdomen is negative.  IMPRESSION:  1. Asymmetric elevation of the right hemidiaphragm is of uncertain  etiology. This may reflect diaphragmatic paralysis which could be  confirmed with a "sniff test"  2. Pulmonary nodules are noted in both lungs. These have a  nonspecific appearance. These may be the sequela of inflammation  or infection. Pulmonary metastatic disease is less favored but not  excluded.      Assessment & Plan:    Multiple lung nodules Need to repeat the CT scan of the chest 10/13.   Asthma, extrinsic, without status asthmaticus Has stabilized since recent aspiration event and exacerbation.  - continue Symbicort + atrovent qid - consider change to spiriva if atrovent beneficial - saba prn - rov 3 mon

## 2012-07-04 NOTE — Patient Instructions (Addendum)
Please continue your Symbicort twice a day Start using your atrovent 2 puffs 4 times a day Use your albuterol 2 puffs as needed We will perform a CT scan of the chest in October Follow up in 3 months or sooner if you have any problems.

## 2012-07-07 ENCOUNTER — Other Ambulatory Visit: Payer: Self-pay | Admitting: *Deleted

## 2012-07-07 DIAGNOSIS — I6529 Occlusion and stenosis of unspecified carotid artery: Secondary | ICD-10-CM

## 2012-07-11 ENCOUNTER — Encounter (INDEPENDENT_AMBULATORY_CARE_PROVIDER_SITE_OTHER): Payer: Medicare Other

## 2012-07-11 DIAGNOSIS — I6529 Occlusion and stenosis of unspecified carotid artery: Secondary | ICD-10-CM

## 2012-07-17 ENCOUNTER — Telehealth: Payer: Self-pay | Admitting: Cardiology

## 2012-07-17 NOTE — Telephone Encounter (Signed)
Pt rtn call to [facility] from last week to get test results

## 2012-07-17 NOTE — Telephone Encounter (Signed)
**Note De-Identified Sean Lin Obfuscation** Pt. given results of his carotid duplex, he verbalized understanding.

## 2012-08-14 ENCOUNTER — Ambulatory Visit (INDEPENDENT_AMBULATORY_CARE_PROVIDER_SITE_OTHER)
Admission: RE | Admit: 2012-08-14 | Discharge: 2012-08-14 | Disposition: A | Discharge status: Home / Self Care | Payer: Medicare Other | Source: Ambulatory Visit | Attending: Emergency Medicine | Admitting: Emergency Medicine

## 2012-08-14 DIAGNOSIS — R918 Other nonspecific abnormal finding of lung field: Secondary | ICD-10-CM

## 2012-09-12 NOTE — Progress Notes (Signed)
Quick Note:  Called and spoke with patient. Aware of results as listed below per Dr. Delton Coombes. Verbalized understanding of this and nothing further needed at this time. ______

## 2012-09-13 ENCOUNTER — Other Ambulatory Visit: Payer: Self-pay | Admitting: Emergency Medicine

## 2012-10-04 ENCOUNTER — Encounter: Payer: Self-pay | Admitting: Emergency Medicine

## 2012-10-04 ENCOUNTER — Ambulatory Visit (INDEPENDENT_AMBULATORY_CARE_PROVIDER_SITE_OTHER): Payer: Medicare Other | Admitting: Emergency Medicine

## 2012-10-04 VITALS — BP 110/60 | HR 69 | Temp 99.3°F | Ht 69.0 in | Wt 203.0 lb

## 2012-10-04 DIAGNOSIS — R918 Other nonspecific abnormal finding of lung field: Secondary | ICD-10-CM

## 2012-10-04 DIAGNOSIS — J45909 Unspecified asthma, uncomplicated: Secondary | ICD-10-CM

## 2012-10-04 NOTE — Assessment & Plan Note (Signed)
Needs repeat CT scan chest in 09/2013

## 2012-10-04 NOTE — Progress Notes (Signed)
Subjective:    Patient ID: Sean Lin, male    DOB: [birth year], 76 y.o.   MRN: [id number] HPI 77 yo man, hx former tobacco (8 pk-yrs), CAD/CABG '91, HTN, DM, prostate CA. Tells me he was dx with asthma about 15 yrs ago, has been on Advair in past, also Maxair. Currently on QVAR + albuterol. Was dx with CAP in [place] approximately about a month ago. Started as URI symptoms that evolved to sinus drainage and then to chest congestion, cough.  CXR was performed that showed possible PNA, treated with Avelox + Pred.  Course c/b diarrhea.   PULMONARY FUNCTON TEST 10/11/2011  FVC 3.21  FEV1 1.28  FEV1/FVC 39.9  FVC  % Predicted 54  FEV % Predicted 47  FeF 25-75 0.54  FeF 25-75 % Predicted 2.43   ROV 11/05/11 -- follows up for his asthma. Last time we changed QVAR to Symbicort to see if he would benefit. He is better, has only needed SABA once since last time. No side effects, no problems tolerating. He wants to stay on it.   ROV 07/04/12 -- former tobacco (8 pk-yrs), CAD/CABG '91, HTN, DM, prostate CA. 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 Also R HD paralysis with associated restriction. He was admitted to [facility] 8/13 with an apparent exacerbation  - was started on a new inhaled med + symbicort + saba prn. He describes an episode where he had trouble getting choked on a pill, this led to a decompensation and asthma flare.   ROV 10/04/12 -- follows up for asthma and R HD paralysis.  Currently on Symbicort bid. Uses his albuterol prn - averages a few times a week. He had small scattered nodules on CT scan 02/22/12, repeat scan 11/13 shows no change as below. No exacerbations. Doing well.      Objective:   Physical Exam Filed Vitals:   10/04/12 1533  BP: 110/60  Pulse: 69  Temp: 99.3 F (37.4 C)   Gen: Pleasant, overwt man, in no distress,  normal affect  ENT: No lesions,  mouth clear,  oropharynx clear, no postnasal drip, some hoarse voice  Neck: No JVD, no TMG, no carotid  bruits  Lungs: No use of accessory muscles, distant, few scattered crackles, no wheezes. 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  Findings: No pathologically enlarged mediastinal or axillary lymph  nodes. Hilar regions are difficult to definitively evaluate  without IV contrast. Atherosclerotic calcification of the arterial  vasculature. Heart size normal. No pericardial effusion. Pre  pericardiac lymph nodes are tiny.  There are a few residual scattered pulmonary nodules measuring less  than 4 mm in size, unchanged 02/18/2012. Previously seen  peribronchovascular nodularity and airspace disease in the right  lung have resolved in the interval. Chronic volume loss is seen in  the right lung base, along the right hemidiaphragm. No pleural  fluid. Airway is unremarkable.  Incidental imaging of the upper abdomen shows a 1.3 cm low  attenuation lesion in the right kidney, as before. No worrisome  lytic or sclerotic lesions. Degenerative changes are seen in the  spine. Incidental imaging of the cervicothoracic junction reveals  grade 1 anterolisthesis of  C7 on T1.  IMPRESSION:  1. Interval clearing of peribronchovascular airspace disease and  nodularity in the right lung.  2. Residual tiny pulmonary nodules (less than 4 mm in size) are  few in number and stable. Given the patient's increased risk for  bronchogenic carcinoma, follow-up could be performed in 12 months  to ensure stability, as clinically indicated.     Assessment & Plan:    Multiple lung nodules Needs repeat CT scan chest in 09/2013  Asthma, extrinsic, without status  asthmaticus Stable on current regimen.

## 2012-10-04 NOTE — Patient Instructions (Addendum)
We will continue your Symbicort and albuterol as you are using them We will repeat your CT scan of the chest in November 2014 Follow with Dr Delton Coombes in 6 months or sooner if you have any problems

## 2012-10-04 NOTE — Assessment & Plan Note (Signed)
Stable on current regimen   

## 2013-05-30 ENCOUNTER — Encounter: Payer: Self-pay | Admitting: Emergency Medicine

## 2013-05-30 ENCOUNTER — Ambulatory Visit (INDEPENDENT_AMBULATORY_CARE_PROVIDER_SITE_OTHER): Payer: Medicare Other | Admitting: Emergency Medicine

## 2013-05-30 VITALS — BP 110/58 | HR 64 | Temp 98.2°F | Ht 69.5 in | Wt 200.8 lb

## 2013-05-30 DIAGNOSIS — J4530 Mild persistent asthma, uncomplicated: Secondary | ICD-10-CM

## 2013-05-30 DIAGNOSIS — J45909 Unspecified asthma, uncomplicated: Secondary | ICD-10-CM

## 2013-05-30 DIAGNOSIS — R918 Other nonspecific abnormal finding of lung field: Secondary | ICD-10-CM

## 2013-05-30 NOTE — Assessment & Plan Note (Signed)
Appears to be stable. May need [place] to add an allergy regimen before next spring as this appears to be when he flares.

## 2013-05-30 NOTE — Progress Notes (Signed)
Subjective:    Patient ID: Sean Lin, male    DOB: May 23, 1935, 78 y.o.   MRN: [id number] HPI 78 yo man, hx former tobacco (8 pk-yrs), CAD/CABG '91, HTN, DM, prostate CA. Tells me he was dx with asthma about 15 yrs ago, has been on Advair in past, also Maxair. Currently on QVAR + albuterol. Was dx with CAP in [place] approximately about a month ago. Started as URI symptoms that evolved to sinus drainage and then to chest congestion, cough.  CXR was performed that showed possible PNA, treated with Avelox + Pred.  Course c/b diarrhea.   PULMONARY FUNCTON TEST 10/11/2011  FVC 3.21  FEV1 1.28  FEV1/FVC 39.9  FVC  % Predicted 54  FEV % Predicted 47  FeF 25-75 0.54  FeF 25-75 % Predicted 2.43   ROV 11/05/11 -- follows up for his asthma. Last time we changed QVAR to Symbicort to see if he would benefit. He is better, has only needed SABA once since last time. No side effects, no problems tolerating. He wants to stay on it.   ROV 07/04/12 -- former tobacco (8 pk-yrs), CAD/CABG '91, HTN, DM, prostate CA. 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 Also R HD paralysis with associated restriction. He was admitted to [facility] 8/13 with an apparent exacerbation  - was started on a new inhaled med + symbicort + saba prn. He describes an episode where he had trouble getting choked on a pill, this led to a decompensation and asthma flare.   ROV 10/04/12 -- follows up for asthma and R HD paralysis.  Currently on Symbicort bid. Uses his albuterol prn - averages a few times a week. He had small scattered nodules on CT scan 02/22/12, repeat scan 11/13 shows no change as below. No exacerbations. Doing well.   ROV 05/30/13 -- follows up for asthma (severe AFL) and R HD paralysis. Currently on symbicort + SABA prn. He reports that he was treated with abx + pred x 2 this spring. For the last 2 months has been well. He was having more congestion, mucous, sputum at that time. He has been more active lately, working  some outside. Using albuterol 2x a day.      Objective:   Physical Exam Filed Vitals:   05/30/13 1538  BP: 110/58  Pulse: 64  Temp: 98.2 F (36.8 C)   Gen: Pleasant, overwt man, in no distress,  normal affect  ENT: No lesions,  mouth clear,  oropharynx clear, no postnasal drip, some hoarse voice  Neck: No JVD, no TMG, no carotid bruits  Lungs: No use of accessory muscles, distant, few scattered crackles, no wheezes. He does wheeze on a forced expiration  Cardiovascular: RRR, heart sounds normal, no murmur or gallops, no peripheral edema  Musculoskeletal: No deformities, no cyanosis or clubbing  Neuro: alert, non focal  Skin: Warm, no lesions or rashes   Sniff Test 02/21/12:  IMPRESSION:  Elevation of the right hemidiaphragm with associated diaphragmatic  Paralysis  09/12/12 --  Comparison: 02/18/2012.  Findings: No pathologically enlarged mediastinal or axillary lymph  nodes. Hilar regions are difficult to definitively evaluate  without IV contrast. Atherosclerotic calcification of the arterial  vasculature. Heart size normal. No pericardial effusion. Pre  pericardiac lymph nodes are tiny.  There are a few residual scattered pulmonary nodules measuring less  than 4 mm in size, unchanged 02/18/2012. Previously seen  peribronchovascular nodularity and airspace disease in the right  lung have resolved in the interval.  Chronic volume loss is seen in  the right lung base, along the right hemidiaphragm. No pleural  fluid. Airway is unremarkable.  Incidental imaging of the upper abdomen shows a 1.3 cm low  attenuation lesion in the right kidney, as before. No worrisome  lytic or sclerotic lesions. Degenerative changes are seen in the  spine. Incidental imaging of the cervicothoracic junction reveals  grade 1 anterolisthesis of C7 on T1.  IMPRESSION:  1. Interval clearing of peribronchovascular airspace disease and  nodularity in the right lung.  2. Residual tiny  pulmonary nodules (less than 4 mm in size) are  few in number and stable. Given the patient's increased risk for  bronchogenic carcinoma, follow-up could be performed in 12 months  to ensure stability, as clinically indicated.     Assessment & Plan:    Asthma, extrinsic, without status asthmaticus Appears to be stable. May need [place] to add an allergy regimen before next spring as this appears to be when he flares.   Multiple lung nodules Follow up CT scan in 11/14

## 2013-05-30 NOTE — Assessment & Plan Note (Signed)
Follow up CT scan in 11/14

## 2013-05-30 NOTE — Patient Instructions (Addendum)
Continue your medications as you have been taking them  Flu shot this Fall Follow with Dr Delton Coombes in 6 months or sooner if you have any problems

## 2013-06-28 ENCOUNTER — Other Ambulatory Visit: Payer: Self-pay | Admitting: Emergency Medicine

## 2013-06-28 ENCOUNTER — Other Ambulatory Visit: Payer: Self-pay | Admitting: Cardiology

## 2013-09-17 ENCOUNTER — Ambulatory Visit (INDEPENDENT_AMBULATORY_CARE_PROVIDER_SITE_OTHER)
Admission: RE | Admit: 2013-09-17 | Discharge: 2013-09-17 | Disposition: A | Discharge status: Home / Self Care | Payer: Medicare Other | Source: Ambulatory Visit | Attending: Emergency Medicine | Admitting: Emergency Medicine

## 2013-09-17 DIAGNOSIS — R918 Other nonspecific abnormal finding of lung field: Secondary | ICD-10-CM

## 2013-10-01 ENCOUNTER — Ambulatory Visit (INDEPENDENT_AMBULATORY_CARE_PROVIDER_SITE_OTHER): Payer: Medicare Other | Admitting: Adult Health

## 2013-10-01 ENCOUNTER — Encounter: Payer: Self-pay | Admitting: Adult Health

## 2013-10-01 VITALS — BP 108/56 | HR 72 | Temp 98.0°F | Ht 69.5 in | Wt 201.0 lb

## 2013-10-01 DIAGNOSIS — J45909 Unspecified asthma, uncomplicated: Secondary | ICD-10-CM

## 2013-10-01 DIAGNOSIS — J986 Disorders of diaphragm: Secondary | ICD-10-CM

## 2013-10-01 DIAGNOSIS — R918 Other nonspecific abnormal finding of lung field: Secondary | ICD-10-CM

## 2013-10-01 MED ORDER — CEFDINIR 300 MG PO CAPS: 300.0000 mg | ORAL_CAPSULE | Freq: Two times a day (BID) | ORAL | Status: DC

## 2013-10-01 NOTE — Patient Instructions (Addendum)
Omnicef 300mg  Twice daily for 7 days .  Mucinex DM Twice daily  As needed  Cough/congestion  Flutter valve As needed   Eat yogurt while on antibiotic.  Follow up with Urologist to discuss abnormal CT scan.  Please contact office for sooner follow up if symptoms do not improve or worsen or seek emergency care  Follow up Dr. Delton Coombes  In 2 months and As needed

## 2013-10-01 NOTE — Progress Notes (Signed)
Subjective:    Patient ID: Sean Lin, male    DOB: 1935-07-13, 78 y.o.   MRN: [id number] HPI 78 yo man, hx former tobacco (8 pk-yrs), CAD/CABG '91, HTN, DM, prostate CA. Tells me he was dx with asthma about 15 yrs ago, has been on Advair in past, also Maxair. Currently on QVAR + albuterol. Was dx with CAP in [place] approximately about a month ago. Started as URI symptoms that evolved to sinus drainage and then to chest congestion, cough.  CXR was performed that showed possible PNA, treated with Avelox + Pred.  Course c/b diarrhea.   PULMONARY FUNCTON TEST 10/11/2011  FVC 3.21  FEV1 1.28  FEV1/FVC 39.9  FVC  % Predicted 54  FEV % Predicted 47  FeF 25-75 0.54  FeF 25-75 % Predicted 2.43   ROV 11/05/11 -- follows up for his asthma. Last time we changed QVAR to Symbicort to see if he would benefit. He is better, has only needed SABA once since last time. No side effects, no problems tolerating. He wants to stay on it.   ROV 07/04/12 -- former tobacco (8 pk-yrs), CAD/CABG '91, HTN, DM, prostate CA. 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 Also R HD paralysis with associated restriction. He was admitted to [facility] 8/13 with an apparent exacerbation  - was started on a new inhaled med + symbicort + saba prn. He describes an episode where he had trouble getting choked on a pill, this led to a decompensation and asthma flare.   ROV 10/04/12 -- follows up for asthma and R HD paralysis.  Currently on Symbicort bid. Uses his albuterol prn - averages a few times a week. He had small scattered nodules on CT scan 02/22/12, repeat scan 11/13 shows no change as below. No exacerbations. Doing well.   ROV 05/30/13 -- follows up for asthma (severe AFL) and R HD paralysis. Currently on symbicort + SABA prn. He reports that he was treated with abx + pred x 2 this spring. For the last 2 months has been well. He was having more congestion, mucous, sputum at that time. He has been more active lately, working  some outside. Using albuterol 2x a day.   10/01/13 Acute OV  Complains of wheezing, chest congestion, occ prod cough with light gray mucus, some sinus congestion w/ yellow mucus, PND, DOE x5days - denies f/c/s, hemoptysis, nausea, vomiting.  Had CT chest on 09/17/13 that showed resolved nodules.  Did show incidental right renal lesion ~1.6cm.  Patient denies any hemoptysis, orthopnea, PND, leg swelling, or weight loss. Patient is followed by urology for kidney stones.  Patient remains on Symbicort 2 puffs twice daily, along with Atrovent 2 puffs 4 times daily.     ROS  Constitutional:   No  weight loss, night sweats,  Fevers, chills, fatigue, or  lassitude.  HEENT:   No headaches,  Difficulty swallowing,  Tooth/dental problems, or  Sore throat,                No sneezing, itching, ear ache,  +nasal congestion, post nasal drip,   CV:  No chest pain,  Orthopnea, PND, swelling in lower extremities, anasarca, dizziness, palpitations, syncope.   GI  No heartburn, indigestion, abdominal pain, nausea, vomiting, diarrhea, change in bowel habits, loss of appetite, bloody stools.   Resp:   No chest wall deformity  Skin: no rash or lesions.  GU: no dysuria, change in color of urine, no urgency or frequency.  No  flank pain, no hematuria   MS:  No joint pain or swelling.  No decreased range of motion.  No back pain.  Psych:  No change in mood or affect. No depression or anxiety.  No memory loss.         Objective:   Physical Exam   Gen: Pleasant, over wt man, in no distress,  normal affect  ENT: No lesions,  mouth clear,  oropharynx clear, no postnasal drip, some hoarse voice  Neck: No JVD, no TMG, no carotid bruits  Lungs: No use of accessory muscles, distant, few rhonchi   Cardiovascular: RRR, heart sounds normal, no murmur or gallops, no peripheral edema  Musculoskeletal: No deformities, no cyanosis or clubbing  Neuro: alert, non focal  Skin: Warm, no lesions or  rashes   Sniff Test 02/21/12:  IMPRESSION:  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 No pathologically  enlarged mediastinal, hilar or axillary lymph nodes are present.  There is stable elevation of the right hemidiaphragm with associated  right basilar atelectasis. No significant pleural or pericardial  effusion is present. The thoracic inlet appears normal.  The multifocal right lung opacities noted on the original study  remain resolved. There is a stable tiny left lower lobe nodule on  image 41. No new or enlarging pulmonary nodules are identified.  There is no confluent airspace opacity.  Images through the upper abdomen demonstrate stable mild hepatic  steatosis and a low-density 1.6 cm right renal lesion on image 59.  There are no worrisome osseous findings.      Assessment & Plan:

## 2013-10-02 NOTE — Assessment & Plan Note (Signed)
Recent serial CT chest 09/17/13 showed resolved nodules with no suspicious pulmonary nodules w/ no recurrent right lung nodularity. However did show low-density 1.6 cm right renal lesion  Discussed findings with pt and pt is followed by Urology . Will make ov to discuss results of CT  Will send note  And CT results to [facility].

## 2013-10-02 NOTE — Assessment & Plan Note (Signed)
Continue flutter valve 

## 2013-10-02 NOTE — Assessment & Plan Note (Signed)
Flare with Bronchitis  Complicated by paralyzed Right Diaphragm   Plan  Omnicef 300mg  Twice daily for 7 days .  Mucinex DM Twice daily  As needed  Cough/congestion  Flutter valve As needed   Eat yogurt while on antibiotic.    Please contact office for sooner follow up if symptoms do not improve or worsen or seek emergency care  Follow up Dr. Delton Lin  In 2 months and As needed

## 2013-10-03 ENCOUNTER — Ambulatory Visit: Payer: Medicare Other | Admitting: Adult Health

## 2013-11-28 ENCOUNTER — Ambulatory Visit: Payer: Medicare Other | Admitting: Emergency Medicine

## 2013-12-12 ENCOUNTER — Ambulatory Visit: Payer: Medicare Other | Admitting: Emergency Medicine

## 2013-12-14 ENCOUNTER — Ambulatory Visit: Payer: Medicare Other | Admitting: Emergency Medicine

## 2013-12-14 ENCOUNTER — Ambulatory Visit (INDEPENDENT_AMBULATORY_CARE_PROVIDER_SITE_OTHER): Payer: Medicare Other | Admitting: Adult Health

## 2013-12-14 ENCOUNTER — Encounter: Payer: Self-pay | Admitting: Adult Health

## 2013-12-14 VITALS — BP 128/72 | HR 77 | Temp 98.8°F

## 2013-12-14 DIAGNOSIS — J45909 Unspecified asthma, uncomplicated: Secondary | ICD-10-CM

## 2013-12-14 NOTE — Patient Instructions (Signed)
Continue your medications as you have been taking them  Follow with Dr Lamonte Sakai in 3-4  months or sooner if you have any problems

## 2013-12-14 NOTE — Progress Notes (Signed)
Subjective:    Patient ID: Sean Lin, male    DOB: 1935/05/11, 79 y.o.   MRN: [id number] HPI 79 yo man, hx former tobacco (8 pk-yrs), CAD/CABG '91, HTN, DM, prostate CA. Tells me he was dx with asthma about 15 yrs ago, has been on Advair in past, also Maxair. Currently on QVAR + albuterol. Was dx with CAP in [place] approximately about a month ago. Started as URI symptoms that evolved to sinus drainage and then to chest congestion, cough.  CXR was performed that showed possible PNA, treated with Avelox + Pred.  Course c/b diarrhea.   PULMONARY FUNCTON TEST 10/11/2011  FVC 3.21  FEV1 1.28  FEV1/FVC 39.9  FVC  % Predicted 54  FEV % Predicted 47  FeF 25-75 0.54  FeF 25-75 % Predicted 2.43   ROV 11/05/11 -- follows up for his asthma. Last time we changed QVAR to Symbicort to see if he would benefit. He is better, has only needed SABA once since last time. No side effects, no problems tolerating. He wants to stay on it.   ROV 07/04/12 -- former tobacco (8 pk-yrs), CAD/CABG '91, HTN, DM, prostate CA. 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 Also R HD paralysis with associated restriction. He was admitted to [facility] 8/13 with an apparent exacerbation  - was started on a new inhaled med + symbicort + saba prn. He describes an episode where he had trouble getting choked on a pill, this led to a decompensation and asthma flare.   ROV 10/04/12 -- follows up for asthma and R HD paralysis.  Currently on Symbicort bid. Uses his albuterol prn - averages a few times a week. He had small scattered nodules on CT scan 02/22/12, repeat scan 11/13 shows no change as below. No exacerbations. Doing well.   ROV 05/30/13 -- follows up for asthma (severe AFL) and R HD paralysis. Currently on symbicort + SABA prn. He reports that he was treated with abx + pred x 2 this spring. For the last 2 months has been well. He was having more congestion, mucous, sputum at that time. He has been more active lately, working  some outside. Using albuterol 2x a day.   10/01/13 Acute OV  Complains of wheezing, chest congestion, occ prod cough with light gray mucus, some sinus congestion w/ yellow mucus, PND, DOE x5days - denies f/c/s, hemoptysis, nausea, vomiting.  Had CT chest on 09/17/13 that showed resolved nodules.  Did show incidental right renal lesion ~1.6cm.  Patient denies any hemoptysis, orthopnea, PND, leg swelling, or weight loss. Patient is followed by urology for kidney stones.  Patient remains on Symbicort 2 puffs twice daily, along with Atrovent 2 puffs 4 times daily.  12/14/13 Follow up  RB pt here for 2 month Asthma follow up - reports breathing is doing well overall, no new complaints. Remains on Symbicort 2 puffs twice daily. Patient denies any chest pain, hemoptysis, orthopnea, PND, or leg swelling  ROS  Constitutional:   No  weight loss, night sweats,  Fevers, chills, fatigue, or  lassitude.  HEENT:   No headaches,  Difficulty swallowing,  Tooth/dental problems, or  Sore throat,                No sneezing, itching, ear ache,  +nasal congestion, post nasal drip,   CV:  No chest pain,  Orthopnea, PND, swelling in lower extremities, anasarca, dizziness, palpitations, syncope.   GI  No heartburn, indigestion, abdominal pain, nausea, vomiting, diarrhea,  change in bowel habits, loss of appetite, bloody stools.   Resp:   No chest wall deformity  Skin: no rash or lesions.  GU: no dysuria, change in color of urine, no urgency or frequency.  No flank pain, no hematuria   MS:  No joint pain or swelling.  No decreased range of motion.  No back pain.  Psych:  No change in mood or affect. No depression or anxiety.  No memory loss.         Objective:   Physical Exam   Gen: Pleasant, over wt man, in no distress,  normal affect  ENT: No lesions,  mouth clear,  oropharynx clear, no postnasal drip, some hoarse voice  Neck: No JVD, no TMG, no carotid bruits  Lungs: No use of accessory  muscles, distant, few rhonchi   Cardiovascular: RRR, heart sounds normal, no murmur or gallops, no peripheral edema  Musculoskeletal: No deformities, no cyanosis or clubbing  Neuro: alert, non focal  Skin: Warm, no lesions or rashes   Sniff Test 02/21/12:  IMPRESSION:  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 No pathologically  enlarged mediastinal, hilar or axillary lymph nodes are present.  There is stable elevation of the right hemidiaphragm with associated  right basilar atelectasis. No significant pleural or pericardial  effusion is present. The thoracic inlet appears normal.  The multifocal right lung opacities noted on the original study  remain resolved. There is a stable tiny left lower lobe nodule on  image 41. No new or enlarging pulmonary nodules are identified.  There is no confluent airspace opacity.  Images through the upper abdomen demonstrate stable mild hepatic  steatosis and a low-density 1.6 cm right renal lesion on image 59.  There are no worrisome osseous findings.      Assessment & Plan:

## 2013-12-17 NOTE — Assessment & Plan Note (Signed)
Compensated on present regimen. Continue on current to follow back up with Dr. Lamonte Sakai in 4 months

## 2014-01-24 ENCOUNTER — Telehealth: Payer: Self-pay | Admitting: Emergency Medicine

## 2014-01-24 NOTE — Telephone Encounter (Signed)
Called, spoke with pt's wife.  Reports pt "has chest congestion but has an appt with Dr. Bea Graff in the morning.  Dr. Lamonte Sakai can't see him until April 13."  Assured Mrs. Primeau that one of RB's partner's or TP would be happy to see pt prior to April 13.  She declined this stating "he has an appt anyway tomorrow morning for a physical with Dr. Bea Graff.  He can just check him then.  If he needs anything else, I'll call back tomorrow after the appt."  Per wife, nothing further is needed from our office at this time.  Reports she will call back if needed.

## 2014-01-31 ENCOUNTER — Other Ambulatory Visit: Payer: Self-pay | Admitting: Cardiology

## 2014-02-01 NOTE — Telephone Encounter (Signed)
Would need to call pt and schedule follow up if he plans on coming back.

## 2014-02-11 ENCOUNTER — Ambulatory Visit: Payer: Medicare Other | Admitting: Emergency Medicine

## 2014-02-22 ENCOUNTER — Encounter: Payer: Self-pay | Admitting: Emergency Medicine

## 2014-02-22 ENCOUNTER — Ambulatory Visit (INDEPENDENT_AMBULATORY_CARE_PROVIDER_SITE_OTHER): Payer: Medicare Other | Admitting: Emergency Medicine

## 2014-02-22 VITALS — BP 150/90 | HR 81 | Ht 70.0 in | Wt 199.0 lb

## 2014-02-22 DIAGNOSIS — J45909 Unspecified asthma, uncomplicated: Secondary | ICD-10-CM

## 2014-02-22 NOTE — Patient Instructions (Signed)
Please continue your symbicort twice a day Use albuterol 2 puffs if needed for shortness of breath.  Follow with Dr Lamonte Sakai in 6 months or sooner if you have any problems

## 2014-02-22 NOTE — Progress Notes (Signed)
Subjective:    Patient ID: Sean Lin, male    DOB: Feb 23, 1935, 79 y.o.   MRN: [id number] HPI 79 yo man, hx former tobacco (8 pk-yrs), CAD/CABG '91, HTN, DM, prostate CA. Tells me he was dx with asthma about 15 yrs ago, has been on Advair in past, also Maxair. Currently on QVAR + albuterol. Was dx with CAP in [place] approximately about a month ago. Started as URI symptoms that evolved to sinus drainage and then to chest congestion, cough.  CXR was performed that showed possible PNA, treated with Avelox + Pred.  Course c/b diarrhea.   PULMONARY FUNCTON TEST 10/11/2011  FVC 3.21  FEV1 1.28  FEV1/FVC 39.9  FVC  % Predicted 54  FEV % Predicted 47  FeF 25-75 0.54  FeF 25-75 % Predicted 2.43   ROV 11/05/11 -- follows up for his asthma. Last time we changed QVAR to Symbicort to see if he would benefit. He is better, has only needed SABA once since last time. No side effects, no problems tolerating. He wants to stay on it.   ROV 07/04/12 -- former tobacco (8 pk-yrs), CAD/CABG '91, HTN, DM, prostate CA. 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 Also R HD paralysis with associated restriction. He was admitted to [facility] 8/13 with an apparent exacerbation  - was started on a new inhaled med + symbicort + saba prn. He describes an episode where he had trouble getting choked on a pill, this led to a decompensation and asthma flare.   ROV 10/04/12 -- follows up for asthma and R HD paralysis.  Currently on Symbicort bid. Uses his albuterol prn - averages a few times a week. He had small scattered nodules on CT scan 02/22/12, repeat scan 11/13 shows no change as below. No exacerbations. Doing well.   ROV 05/30/13 -- follows up for asthma (severe AFL) and R HD paralysis. Currently on symbicort + SABA prn. He reports that he was treated with abx + pred x 2 this spring. For the last 2 months has been well. He was having more congestion, mucous, sputum at that time. He has been more active lately, working  some outside. Using albuterol 2x a day.   10/01/13 Acute OV  Complains of wheezing, chest congestion, occ prod cough with light gray mucus, some sinus congestion w/ yellow mucus, PND, DOE x5days - denies f/c/s, hemoptysis, nausea, vomiting.  Had CT chest on 09/17/13 that showed resolved nodules.  Did show incidental right renal lesion ~1.6cm.  Patient denies any hemoptysis, orthopnea, PND, leg swelling, or weight loss. Patient is followed by urology for kidney stones.  Patient remains on Symbicort 2 puffs twice daily, along with Atrovent 2 puffs 4 times daily.  12/14/13 Follow up  RB pt here for 2 month Asthma follow up - reports breathing is doing well overall, no new complaints. Remains on Symbicort 2 puffs twice daily. Patient denies any chest pain, hemoptysis, orthopnea, PND, or leg swelling  ROV 02/22/14 -- follows up for asthma, severe AFL, R HD paralysis. Also w resolved pulm nodules by CT 09/17/13. He is using symbicort.  Uses ventolin bid also.  He had some increased congestion late March, no overt exacerbations.       Objective:   Physical Exam  Filed Vitals:   02/22/14 1507  BP: 150/90  Pulse: 81  Height: 5\' 10"  (1.778 m)  Weight: 199 lb (90.266 kg)  SpO2: 93%    Gen: Pleasant, over wt man, in no distress,  normal affect  ENT: No lesions,  mouth clear,  oropharynx clear, no postnasal drip, some hoarse voice  Neck: No JVD, no TMG, no carotid bruits  Lungs: No use of accessory muscles, distant, few rhonchi   Cardiovascular: RRR, heart sounds normal, no murmur or gallops, no peripheral edema  Musculoskeletal: No deformities, no cyanosis or clubbing  Neuro: alert, non focal  Skin: Warm, no lesions or rashes   Sniff Test 02/21/12:  IMPRESSION:  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 No  pathologically  enlarged mediastinal, hilar or axillary lymph nodes are present.  There is stable elevation of the right hemidiaphragm with associated  right basilar atelectasis. No significant pleural or pericardial  effusion is present. The thoracic inlet appears normal.  The multifocal right lung opacities noted on the original study  remain resolved. There is a stable tiny left lower lobe nodule on  image 41. No new or enlarging pulmonary nodules are identified.  There is no confluent airspace opacity.  Images through the upper abdomen demonstrate stable mild hepatic  steatosis and a low-density 1.6 cm right renal lesion on image 59.  There are no worrisome osseous findings.      Assessment & Plan:     Asthma, extrinsic, without status asthmaticus Well compensated at this time. No flares.  - continue same symbicort and albuterol prn - rov 6

## 2014-02-22 NOTE — Assessment & Plan Note (Signed)
Well compensated at this time. No flares.  - continue same symbicort and albuterol prn - rov 6

## 2014-03-05 ENCOUNTER — Other Ambulatory Visit: Payer: Self-pay | Admitting: Cardiology

## 2014-03-18 ENCOUNTER — Other Ambulatory Visit: Payer: Self-pay | Admitting: Emergency Medicine

## 2014-03-18 ENCOUNTER — Telehealth: Payer: Self-pay | Admitting: Emergency Medicine

## 2014-03-18 MED ORDER — BUDESONIDE-FORMOTEROL FUMARATE 160-4.5 MCG/ACT IN AERO: INHALATION_SPRAY | RESPIRATORY_TRACT | Status: DC

## 2014-03-18 NOTE — Telephone Encounter (Signed)
Spoke with spouse. Pt needs refill on symbicort. I advised will send this in. Nothing further needed

## 2014-07-26 ENCOUNTER — Encounter: Payer: Self-pay | Admitting: Adult Health

## 2014-07-26 ENCOUNTER — Ambulatory Visit (INDEPENDENT_AMBULATORY_CARE_PROVIDER_SITE_OTHER): Payer: Medicare Other | Admitting: Adult Health

## 2014-07-26 VITALS — BP 132/66 | HR 67 | Temp 98.5°F | Ht 69.5 in | Wt 197.2 lb

## 2014-07-26 DIAGNOSIS — J45901 Unspecified asthma with (acute) exacerbation: Secondary | ICD-10-CM

## 2014-07-26 DIAGNOSIS — J4541 Moderate persistent asthma with (acute) exacerbation: Secondary | ICD-10-CM

## 2014-07-26 MED ORDER — CEFDINIR 300 MG PO CAPS: 300.0000 mg | ORAL_CAPSULE | Freq: Two times a day (BID) | ORAL | Status: DC

## 2014-07-26 MED ORDER — PREDNISONE 10 MG PO TABS: ORAL_TABLET | ORAL | Status: DC

## 2014-07-26 NOTE — Assessment & Plan Note (Addendum)
Exacerbation with bronchitis  xopenex neb x 1 in office   Plan  Omnicef 300mg  Twice daily for 7 days .  Mucinex DM Twice daily  As needed  Cough/congestion  Prednisone taper over next week.  Eat yogurt while on antibiotic.  Please contact office for sooner follow up if symptoms do not improve or worsen or seek emergency care  Follow up Dr. Lamonte Sakai  As planned and As needed

## 2014-07-26 NOTE — Progress Notes (Signed)
Subjective:    Patient ID: Sean Lin, male    DOB: 05-11-1935, 79 y.o.   MRN: [id number] HPI 79 yo man, hx former tobacco (8 pk-yrs), CAD/CABG '91, HTN, DM, prostate CA. Tells me he was dx with asthma about 15 yrs ago, has been on Advair in past, also Maxair. Currently on QVAR + albuterol. Was dx with CAP in [place] approximately about a month ago. Started as URI symptoms that evolved to sinus drainage and then to chest congestion, cough.  CXR was performed that showed possible PNA, treated with Avelox + Pred.  Course c/b diarrhea.   PULMONARY FUNCTON TEST 10/11/2011  FVC 3.21  FEV1 1.28  FEV1/FVC 39.9  FVC  % Predicted 54  FEV % Predicted 47  FeF 25-75 0.54  FeF 25-75 % Predicted 2.43   ROV 11/05/11 -- follows up for his asthma. Last time we changed QVAR to Symbicort to see if he would benefit. He is better, has only needed SABA once since last time. No side effects, no problems tolerating. He wants to stay on it.   ROV 07/04/12 -- former tobacco (8 pk-yrs), CAD/CABG '91, HTN, DM, prostate CA. 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 Also R HD paralysis with associated restriction. He was admitted to [facility] 8/13 with an apparent exacerbation  - was started on a new inhaled med + symbicort + saba prn. He describes an episode where he had trouble getting choked on a pill, this led to a decompensation and asthma flare.   ROV 10/04/12 -- follows up for asthma and R HD paralysis.  Currently on Symbicort bid. Uses his albuterol prn - averages a few times a week. He had small scattered nodules on CT scan 02/22/12, repeat scan 11/13 shows no change as below. No exacerbations. Doing well.   ROV 05/30/13 -- follows up for asthma (severe AFL) and R HD paralysis. Currently on symbicort + SABA prn. He reports that he was treated with abx + pred x 2 this spring. For the last 2 months has been well. He was having more congestion, mucous, sputum at that time. He has been more active lately, working  some outside. Using albuterol 2x a day.   10/01/13 Acute OV  Complains of wheezing, chest congestion, occ prod cough with light gray mucus, some sinus congestion w/ yellow mucus, PND, DOE x5days - denies f/c/s, hemoptysis, nausea, vomiting.  Had CT chest on 09/17/13 that showed resolved nodules.  Did show incidental right renal lesion ~1.6cm.  Patient denies any hemoptysis, orthopnea, PND, leg swelling, or weight loss. Patient is followed by urology for kidney stones.  Patient remains on Symbicort 2 puffs twice daily, along with Atrovent 2 puffs 4 times daily.  12/14/13 Follow up  RB pt here for 2 month Asthma follow up - reports breathing is doing well overall, no new complaints. Remains on Symbicort 2 puffs twice daily. Patient denies any chest pain, hemoptysis, orthopnea, PND, or leg swelling  ROV 02/22/14 -- follows up for asthma, severe AFL, R HD paralysis. Also w resolved pulm nodules by CT 09/17/13. He is using symbicort.  Uses ventolin bid also.  He had some increased congestion late March, no overt exacerbations.   07/26/14 Acute OV  Hx of Asthma, severe AFL , R. HD paralysis  Pt complains of  increased SOB, wheezing, tightness, prod cough with gray/green mucus x1 week.   Denies any f/c/s, n/v/d, hemoptysis, PND, leg swelling On Symbicort Twice daily .  Using mucinex with  some help.  No recent travel or abx use.     ROS:  Constitutional:   No  weight loss, night sweats,  Fevers, chills,  +fatigue, or  lassitude.  HEENT:   No headaches,  Difficulty swallowing,  Tooth/dental problems, or  Sore throat,                No sneezing, itching, ear ache,  +nasal congestion, post nasal drip,   CV:  No chest pain,  Orthopnea, PND, swelling in lower extremities, anasarca, dizziness, palpitations, syncope.   GI  No heartburn, indigestion, abdominal pain, nausea, vomiting, diarrhea, change in bowel habits, loss of appetite, bloody stools.   Resp:  .  No chest wall deformity  Skin: no  rash or lesions.  GU: no dysuria, change in color of urine, no urgency or frequency.  No flank pain, no hematuria   MS:  No joint pain or swelling.  No decreased range of motion.  No back pain.  Psych:  No change in mood or affect. No depression or anxiety.  No memory loss.         Objective:   Physical Exam    Gen: Pleasant, over wt man, in no distress,  normal affect  ENT: No lesions,  mouth clear,  oropharynx clear, no postnasal drip, some hoarse voice  Neck: No JVD, no TMG, no carotid bruits  Lungs: No use of accessory muscles, distant, few rhonchi and tr exp wheezing , barking cough, no stridor    Cardiovascular: RRR, heart sounds normal, no murmur or gallops, no peripheral edema  Musculoskeletal: No deformities, no cyanosis or clubbing  Neuro: alert, non focal  Skin: Warm, no lesions or rashes   Sniff Test 02/21/12:  IMPRESSION:  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 No pathologically  enlarged mediastinal, hilar or axillary lymph nodes are present.  There is stable elevation of the right hemidiaphragm with associated  right basilar atelectasis. No significant pleural or pericardial  effusion is present. The thoracic inlet appears normal.  The multifocal right lung opacities noted on the original study  remain resolved. There is a stable tiny left lower lobe nodule on  image 41. No new or enlarging pulmonary nodules are identified.  There is no confluent airspace opacity.  Images through the upper abdomen demonstrate stable mild hepatic  steatosis and a low-density 1.6 cm right renal lesion on image 59.  There are no worrisome osseous findings.      Assessment & Plan:     No problem-specific assessment & plan notes found for this encounter.

## 2014-07-26 NOTE — Patient Instructions (Signed)
Omnicef 300mg  Twice daily for 7 days .  Mucinex DM Twice daily  As needed  Cough/congestion  Prednisone taper over next week.  Eat yogurt while on antibiotic.  Please contact office for sooner follow up if symptoms do not improve or worsen or seek emergency care  Follow up Dr. Lamonte Sakai  As planned and As needed

## 2014-08-01 ENCOUNTER — Ambulatory Visit (INDEPENDENT_AMBULATORY_CARE_PROVIDER_SITE_OTHER): Payer: Medicare Other | Admitting: Emergency Medicine

## 2014-08-01 ENCOUNTER — Encounter: Payer: Self-pay | Admitting: Emergency Medicine

## 2014-08-01 VITALS — BP 162/78 | HR 84 | Temp 98.0°F | Ht 70.0 in | Wt 195.2 lb

## 2014-08-01 DIAGNOSIS — J4541 Moderate persistent asthma with (acute) exacerbation: Secondary | ICD-10-CM

## 2014-08-01 NOTE — Assessment & Plan Note (Signed)
Please continue your Symbicort twice a day Use Ventolin as needed Use your nasal saline rinses as needed Follow with Dr Lamonte Sakai in 6 months or sooner if you have any problems

## 2014-08-01 NOTE — Progress Notes (Signed)
Subjective:    Patient ID: Sean Lin, male    DOB: 05-11-1935, 79 y.o.   MRN: [id number] HPI 79 yo man, hx former tobacco (8 pk-yrs), CAD/CABG '91, HTN, DM, prostate CA. Tells me he was dx with asthma about 15 yrs ago, has been on Advair in past, also Maxair. Currently on QVAR + albuterol. Was dx with CAP in [place] approximately about a month ago. Started as URI symptoms that evolved to sinus drainage and then to chest congestion, cough.  CXR was performed that showed possible PNA, treated with Avelox + Pred.  Course c/b diarrhea.   PULMONARY FUNCTON TEST 10/11/2011  FVC 3.21  FEV1 1.28  FEV1/FVC 39.9  FVC  % Predicted 54  FEV % Predicted 47  FeF 25-75 0.54  FeF 25-75 % Predicted 2.43   ROV 11/05/11 -- follows up for his asthma. Last time we changed QVAR to Symbicort to see if he would benefit. He is better, has only needed SABA once since last time. No side effects, no problems tolerating. He wants to stay on it.   ROV 07/04/12 -- former tobacco (8 pk-yrs), CAD/CABG '91, HTN, DM, prostate CA. 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 Also R HD paralysis with associated restriction. He was admitted to [facility] 8/13 with an apparent exacerbation  - was started on a new inhaled med + symbicort + saba prn. He describes an episode where he had trouble getting choked on a pill, this led to a decompensation and asthma flare.   ROV 10/04/12 -- follows up for asthma and R HD paralysis.  Currently on Symbicort bid. Uses his albuterol prn - averages a few times a week. He had small scattered nodules on CT scan 02/22/12, repeat scan 11/13 shows no change as below. No exacerbations. Doing well.   ROV 05/30/13 -- follows up for asthma (severe AFL) and R HD paralysis. Currently on symbicort + SABA prn. He reports that he was treated with abx + pred x 2 this spring. For the last 2 months has been well. He was having more congestion, mucous, sputum at that time. He has been more active lately, working  some outside. Using albuterol 2x a day.   10/01/13 Acute OV  Complains of wheezing, chest congestion, occ prod cough with light gray mucus, some sinus congestion w/ yellow mucus, PND, DOE x5days - denies f/c/s, hemoptysis, nausea, vomiting.  Had CT chest on 09/17/13 that showed resolved nodules.  Did show incidental right renal lesion ~1.6cm.  Patient denies any hemoptysis, orthopnea, PND, leg swelling, or weight loss. Patient is followed by urology for kidney stones.  Patient remains on Symbicort 2 puffs twice daily, along with Atrovent 2 puffs 4 times daily.  12/14/13 Follow up  RB pt here for 2 month Asthma follow up - reports breathing is doing well overall, no new complaints. Remains on Symbicort 2 puffs twice daily. Patient denies any chest pain, hemoptysis, orthopnea, PND, or leg swelling  ROV 02/22/14 -- follows up for asthma, severe AFL, R HD paralysis. Also w resolved pulm nodules by CT 09/17/13. He is using symbicort.  Uses ventolin bid also.  He had some increased congestion late March, no overt exacerbations.   07/26/14 Acute OV  Hx of Asthma, severe AFL , R. HD paralysis  Pt complains of  increased SOB, wheezing, tightness, prod cough with gray/green mucus x1 week.   Denies any f/c/s, n/v/d, hemoptysis, PND, leg swelling On Symbicort Twice daily .  Using mucinex with  some help.  No recent travel or abx use.   ROV 08/01/14 -- follows for hx asthma, severe AFL, R HD paralysis. He was just seen by TP on 9/25 for an apparent bronchitis / AE. He was having a lot of cough, throat clearing. Some mucous. He is on symbicort, seems to be tolerating it.  Uses ventolin every day on a schedule.     Objective:   Physical Exam  Filed Vitals:   08/01/14 1009  BP: 162/78  Pulse: 84  Temp: 98 F (36.7 C)  TempSrc: Oral  Height: 5\' 10"  (1.778 m)  Weight: 195 lb 3.2 oz (88.542 kg)  SpO2: 92%     Gen: Pleasant, over wt man, in no distress,  normal affect  ENT: No lesions,  mouth  clear,  oropharynx clear, no postnasal drip, some hoarse voice  Neck: No JVD, no TMG, no carotid bruits  Lungs: No use of accessory muscles, distant, few rhonchi and tr exp wheezing , barking cough, no stridor   Cardiovascular: RRR, heart sounds normal, no murmur or gallops, no peripheral edema  Musculoskeletal: No deformities, no cyanosis or clubbing  Neuro: alert, non focal  Skin: Warm, no lesions or rashes   Sniff Test 02/21/12:  IMPRESSION:  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 No pathologically  enlarged mediastinal, hilar or axillary lymph nodes are present.  There is stable elevation of the right hemidiaphragm with associated  right basilar atelectasis. No significant pleural or pericardial  effusion is present. The thoracic inlet appears normal.  The multifocal right lung opacities noted on the original study  remain resolved. There is a stable tiny left lower lobe nodule on  image 41. No new or enlarging pulmonary nodules are identified.  There is no confluent airspace opacity.  Images through the upper abdomen demonstrate stable mild hepatic  steatosis and a low-density 1.6 cm right renal lesion on image 59.  There are no worrisome osseous findings.      Assessment & Plan:     Asthma, extrinsic, without status asthmaticus Please continue your Symbicort twice a day Use Ventolin as needed Use your nasal saline rinses as needed Follow with Dr Lamonte Sakai in 6 months or sooner if you have any problems

## 2014-08-01 NOTE — Patient Instructions (Signed)
Please continue your Symbicort twice a day Use Ventolin as needed Use your nasal saline rinses as needed Follow with Dr Lamonte Sakai in 6 months or sooner if you have any problems

## 2014-10-08 ENCOUNTER — Other Ambulatory Visit: Payer: Self-pay | Admitting: Emergency Medicine

## 2014-12-13 ENCOUNTER — Telehealth: Payer: Self-pay | Admitting: Emergency Medicine

## 2014-12-13 MED ORDER — PREDNISONE 10 MG PO TABS: ORAL_TABLET | ORAL | Status: DC

## 2014-12-13 NOTE — Telephone Encounter (Signed)
Spoke with pt's wife, states pt was put on a z-pak and prednisone last week by pcp for chest congestion, prod cough with unknown color.  States pt has finished this but is not better.  Pt is requesting an appt with rb next week.  I advised that RB had no openings on his 1 day in clinic next week but scheduled with TP next Friday (next available).  Pt is requesting recs in the meantime.    Sending to Doc of Day as RB is not on the schedule today and I do not know if he is available.  Dr. Elsworth Soho please advise.  Thank you.

## 2014-12-13 NOTE — Telephone Encounter (Signed)
He needs to call PCP back since they have seen him. We can give him more prednisone until seen  - pl see if we can et him earlier Prednisone 10 mg tabs  Take 2 tabs daily with food x 5ds, then 1 tab daily with food x 5ds then STOP

## 2014-12-13 NOTE — Telephone Encounter (Signed)
Pt's wife aware of recs.  Prednisone sent in.  Nothing further needed.

## 2014-12-20 ENCOUNTER — Encounter: Payer: Self-pay | Admitting: Adult Health

## 2014-12-20 ENCOUNTER — Ambulatory Visit (INDEPENDENT_AMBULATORY_CARE_PROVIDER_SITE_OTHER): Payer: Medicare HMO | Admitting: Adult Health

## 2014-12-20 ENCOUNTER — Ambulatory Visit (INDEPENDENT_AMBULATORY_CARE_PROVIDER_SITE_OTHER)
Admission: RE | Admit: 2014-12-20 | Discharge: 2014-12-20 | Disposition: A | Discharge status: Home / Self Care | Payer: Medicare HMO | Source: Ambulatory Visit | Attending: Adult Health | Admitting: Adult Health

## 2014-12-20 VITALS — BP 110/58 | HR 89 | Temp 98.2°F | Ht 69.0 in | Wt 189.0 lb

## 2014-12-20 DIAGNOSIS — J4541 Moderate persistent asthma with (acute) exacerbation: Secondary | ICD-10-CM | POA: Diagnosis not present

## 2014-12-20 MED ORDER — AMOXICILLIN-POT CLAVULANATE 875-125 MG PO TABS: 1.0000 | ORAL_TABLET | Freq: Two times a day (BID) | ORAL | Status: AC

## 2014-12-20 MED ORDER — LEVALBUTEROL HCL 0.63 MG/3ML IN NEBU
0.6300 mg | INHALATION_SOLUTION | Freq: Once | RESPIRATORY_TRACT | Status: AC
  Administered 2014-12-20: 0.63 mg via RESPIRATORY_TRACT

## 2014-12-20 NOTE — Progress Notes (Signed)
Subjective:    Patient ID: Sean Lin, male    DOB: 05-11-1935, 80 y.o.   MRN: [id number] HPI 80 yo man, hx former tobacco (8 pk-yrs), CAD/CABG '91, HTN, DM, prostate CA. Tells me he was dx with asthma about 15 yrs ago, has been on Advair in past, also Maxair. Currently on QVAR + albuterol. Was dx with CAP in [place] approximately about a month ago. Started as URI symptoms that evolved to sinus drainage and then to chest congestion, cough.  CXR was performed that showed possible PNA, treated with Avelox + Pred.  Course c/b diarrhea.   PULMONARY FUNCTON TEST 10/11/2011  FVC 3.21  FEV1 1.28  FEV1/FVC 39.9  FVC  % Predicted 54  FEV % Predicted 47  FeF 25-75 0.54  FeF 25-75 % Predicted 2.43   ROV 11/05/11 -- follows up for his asthma. Last time we changed QVAR to Symbicort to see if he would benefit. He is better, has only needed SABA once since last time. No side effects, no problems tolerating. He wants to stay on it.   ROV 07/04/12 -- former tobacco (8 pk-yrs), CAD/CABG '91, HTN, DM, prostate CA. 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 Also R HD paralysis with associated restriction. He was admitted to [facility] 8/13 with an apparent exacerbation  - was started on a new inhaled med + symbicort + saba prn. He describes an episode where he had trouble getting choked on a pill, this led to a decompensation and asthma flare.   ROV 10/04/12 -- follows up for asthma and R HD paralysis.  Currently on Symbicort bid. Uses his albuterol prn - averages a few times a week. He had small scattered nodules on CT scan 02/22/12, repeat scan 11/13 shows no change as below. No exacerbations. Doing well.   ROV 05/30/13 -- follows up for asthma (severe AFL) and R HD paralysis. Currently on symbicort + SABA prn. He reports that he was treated with abx + pred x 2 this spring. For the last 2 months has been well. He was having more congestion, mucous, sputum at that time. He has been more active lately, working  some outside. Using albuterol 2x a day.   10/01/13 Acute OV  Complains of wheezing, chest congestion, occ prod cough with light gray mucus, some sinus congestion w/ yellow mucus, PND, DOE x5days - denies f/c/s, hemoptysis, nausea, vomiting.  Had CT chest on 09/17/13 that showed resolved nodules.  Did show incidental right renal lesion ~1.6cm.  Patient denies any hemoptysis, orthopnea, PND, leg swelling, or weight loss. Patient is followed by urology for kidney stones.  Patient remains on Symbicort 2 puffs twice daily, along with Atrovent 2 puffs 4 times daily.  12/14/13 Follow up  RB pt here for 2 month Asthma follow up - reports breathing is doing well overall, no new complaints. Remains on Symbicort 2 puffs twice daily. Patient denies any chest pain, hemoptysis, orthopnea, PND, or leg swelling  ROV 02/22/14 -- follows up for asthma, severe AFL, R HD paralysis. Also w resolved pulm nodules by CT 09/17/13. He is using symbicort.  Uses ventolin bid also.  He had some increased congestion late March, no overt exacerbations.   07/26/14 Acute OV  Hx of Asthma, severe AFL , R. HD paralysis  Pt complains of  increased SOB, wheezing, tightness, prod cough with gray/green mucus x1 week.   Denies any f/c/s, n/v/d, hemoptysis, PND, leg swelling On Symbicort Twice daily .  Using mucinex with  some help.  No recent travel or abx use.   ROV 08/01/14 -- follows for hx asthma, severe AFL, R HD paralysis. He was just seen by TP on 9/25 for an apparent bronchitis / AE. He was having a lot of cough, throat clearing. Some mucous. He is on symbicort, seems to be tolerating it.  Uses ventolin every day on a schedule.   12/20/2014 Acute OV :Severe Asthma /R HD paralysis  Patient presents for an acute office visit. He complains of a productive cough with thick green mucus, congestion, shortness of breath and wheezing.. Symptoms started  3 weeks ago. He was called in a Z-Pak and prednisone without much relief. He was  then called in another prednisone pack. However, he has not seen significant decrease in his congestion. He continues to cough up thick yellow-green mucus. He denies any hemoptysis, orthopnea, PND, or increased leg swelling.       ROS  Constitutional:   No  weight loss, night sweats,  Fevers, chills,  +fatigue, or  lassitude.  HEENT:   No headaches,  Difficulty swallowing,  Tooth/dental problems, or  Sore throat,                No sneezing, itching, ear ache, nasal congestion, post nasal drip,   CV:  No chest pain,  Orthopnea, PND, swelling in lower extremities, anasarca, dizziness, palpitations, syncope.   GI  No heartburn, indigestion, abdominal pain, nausea, vomiting, diarrhea, change in bowel habits, loss of appetite, bloody stools.   Resp:    No chest wall deformity  Skin: no rash or lesions.  GU: no dysuria, change in color of urine, no urgency or frequency.  No flank pain, no hematuria   MS:  No joint pain or swelling.  No decreased range of motion.  No back pain.  Psych:  No change in mood or affect. No depression or anxiety.  No memory loss.       Objective:   Physical Exam  Filed Vitals:   12/20/14 1615  BP: 110/58  Pulse: 89  Temp: 98.2 F (36.8 C)  TempSrc: Oral  Height: 5\' 9"  (1.753 m)  Weight: 189 lb (85.73 kg)  SpO2: 91%     Gen: Pleasant, over wt man, in no distress,  normal affect  ENT: No lesions,  mouth clear,  oropharynx clear, no postnasal drip, some hoarse voice  Neck: No JVD, no TMG, no carotid bruits  Lungs: No use of accessory muscles, distant, rhonchi, barking cough, no stridor   Cardiovascular: RRR, heart sounds normal, no murmur or gallops, no peripheral edema  Musculoskeletal: No deformities, no cyanosis or clubbing  Neuro: alert, non focal  Skin: Warm, no lesions or rashes   Sniff Test 02/21/12:  IMPRESSION:  Elevation of the right hemidiaphragm with associated diaphragmatic  Paralysis   CT chest 09/17/13  There is  atherosclerosis of the aorta, great vessels and coronary  arteries status post median sternotomy and CABG. No pathologically  enlarged mediastinal, hilar or axillary lymph nodes are present.  There is stable elevation of the right hemidiaphragm with associated  right basilar atelectasis. No significant pleural or pericardial  effusion is present. The thoracic inlet appears normal.  The multifocal right lung opacities noted on the original study  remain resolved. There is a stable tiny left lower lobe nodule on  image 41. No new or enlarging pulmonary nodules are identified.  There is no confluent airspace opacity.  Images through the upper abdomen demonstrate stable mild hepatic  steatosis and a low-density 1.6 cm right renal lesion on image 59.  There are no worrisome osseous findings.    CXR 12/20/2014 chest x-ray reviewed independently   Enlargement of cardiac silhouette post CABG.  Chronic RIGHT basilar atelectasis without acute infiltrate. Assessment & Plan:     No problem-specific assessment & plan notes found for this encounter.

## 2014-12-20 NOTE — Assessment & Plan Note (Addendum)
Exacerbation with bronchitis slow to resolve  Chest x-ray does not show any sign of pneumonia Xopenex nebulizer treatment given the office  Plan  augmentin 875 mg twice daily for 7 days, take with food Mucinex DM twice daily as needed for cough and congestion fluids and rest Follow Dr. Lamonte Sakai in today 6 weeks and As needed   Please contact office for sooner follow up if symptoms do not improve or worsen or seek emergency care

## 2014-12-20 NOTE — Patient Instructions (Addendum)
Augmentin 875 mg twice daily for 7 days, take with food Mucinex DM twice daily as needed for cough and congestion  Continue with fluids and rest Follow Dr. Lamonte Sakai in today 6 weeks and As needed   Please contact office for sooner follow up if symptoms do not improve or worsen or seek emergency care

## 2015-02-17 ENCOUNTER — Other Ambulatory Visit: Payer: Self-pay | Admitting: Emergency Medicine

## 2015-06-20 ENCOUNTER — Other Ambulatory Visit: Payer: Self-pay | Admitting: Emergency Medicine

## 2015-10-08 ENCOUNTER — Encounter: Payer: Self-pay | Admitting: Internal Medicine

## 2015-10-08 ENCOUNTER — Emergency Department (HOSPITAL_COMMUNITY): Payer: Medicare HMO

## 2015-10-08 ENCOUNTER — Inpatient Hospital Stay (HOSPITAL_COMMUNITY)
Admission: EM | Admit: 2015-10-08 | Discharge: 2015-10-10 | DRG: 871 | Disposition: A | Discharge status: Home / Self Care | Payer: Medicare HMO | Attending: Internal Medicine | Admitting: Internal Medicine

## 2015-10-08 ENCOUNTER — Encounter (HOSPITAL_COMMUNITY): Payer: Self-pay

## 2015-10-08 ENCOUNTER — Ambulatory Visit (INDEPENDENT_AMBULATORY_CARE_PROVIDER_SITE_OTHER): Payer: Medicare HMO | Admitting: Internal Medicine

## 2015-10-08 VITALS — BP 119/64 | HR 110 | Temp 102.8°F | Ht 66.5 in | Wt 195.4 lb

## 2015-10-08 DIAGNOSIS — I2510 Atherosclerotic heart disease of native coronary artery without angina pectoris: Secondary | ICD-10-CM | POA: Diagnosis present

## 2015-10-08 DIAGNOSIS — Z79899 Other long term (current) drug therapy: Secondary | ICD-10-CM | POA: Diagnosis not present

## 2015-10-08 DIAGNOSIS — J986 Disorders of diaphragm: Secondary | ICD-10-CM | POA: Diagnosis present

## 2015-10-08 DIAGNOSIS — I679 Cerebrovascular disease, unspecified: Secondary | ICD-10-CM | POA: Diagnosis present

## 2015-10-08 DIAGNOSIS — J209 Acute bronchitis, unspecified: Secondary | ICD-10-CM | POA: Diagnosis present

## 2015-10-08 DIAGNOSIS — A419 Sepsis, unspecified organism: Principal | ICD-10-CM | POA: Diagnosis present

## 2015-10-08 DIAGNOSIS — I10 Essential (primary) hypertension: Secondary | ICD-10-CM | POA: Diagnosis present

## 2015-10-08 DIAGNOSIS — J440 Chronic obstructive pulmonary disease with acute lower respiratory infection: Secondary | ICD-10-CM | POA: Diagnosis present

## 2015-10-08 DIAGNOSIS — Z87891 Personal history of nicotine dependence: Secondary | ICD-10-CM

## 2015-10-08 DIAGNOSIS — J45901 Unspecified asthma with (acute) exacerbation: Secondary | ICD-10-CM | POA: Diagnosis present

## 2015-10-08 DIAGNOSIS — Z8546 Personal history of malignant neoplasm of prostate: Secondary | ICD-10-CM | POA: Diagnosis not present

## 2015-10-08 DIAGNOSIS — J441 Chronic obstructive pulmonary disease with (acute) exacerbation: Secondary | ICD-10-CM | POA: Diagnosis present

## 2015-10-08 DIAGNOSIS — Z7984 Long term (current) use of oral hypoglycemic drugs: Secondary | ICD-10-CM

## 2015-10-08 DIAGNOSIS — Z885 Allergy status to narcotic agent status: Secondary | ICD-10-CM

## 2015-10-08 DIAGNOSIS — E1165 Type 2 diabetes mellitus with hyperglycemia: Secondary | ICD-10-CM | POA: Diagnosis present

## 2015-10-08 DIAGNOSIS — Z881 Allergy status to other antibiotic agents status: Secondary | ICD-10-CM | POA: Diagnosis not present

## 2015-10-08 DIAGNOSIS — Z7982 Long term (current) use of aspirin: Secondary | ICD-10-CM | POA: Diagnosis not present

## 2015-10-08 DIAGNOSIS — K219 Gastro-esophageal reflux disease without esophagitis: Secondary | ICD-10-CM | POA: Diagnosis present

## 2015-10-08 DIAGNOSIS — Z807 Family history of other malignant neoplasms of lymphoid, hematopoietic and related tissues: Secondary | ICD-10-CM | POA: Diagnosis not present

## 2015-10-08 DIAGNOSIS — D72829 Elevated white blood cell count, unspecified: Secondary | ICD-10-CM | POA: Diagnosis present

## 2015-10-08 DIAGNOSIS — F329 Major depressive disorder, single episode, unspecified: Secondary | ICD-10-CM | POA: Diagnosis present

## 2015-10-08 DIAGNOSIS — R05 Cough: Secondary | ICD-10-CM | POA: Diagnosis present

## 2015-10-08 DIAGNOSIS — J9601 Acute respiratory failure with hypoxia: Secondary | ICD-10-CM

## 2015-10-08 DIAGNOSIS — Z951 Presence of aortocoronary bypass graft: Secondary | ICD-10-CM | POA: Diagnosis not present

## 2015-10-08 DIAGNOSIS — J4541 Moderate persistent asthma with (acute) exacerbation: Secondary | ICD-10-CM | POA: Diagnosis not present

## 2015-10-08 DIAGNOSIS — E119 Type 2 diabetes mellitus without complications: Secondary | ICD-10-CM

## 2015-10-08 DIAGNOSIS — J45909 Unspecified asthma, uncomplicated: Secondary | ICD-10-CM | POA: Diagnosis present

## 2015-10-08 DIAGNOSIS — T380X5A Adverse effect of glucocorticoids and synthetic analogues, initial encounter: Secondary | ICD-10-CM | POA: Diagnosis present

## 2015-10-08 DIAGNOSIS — E785 Hyperlipidemia, unspecified: Secondary | ICD-10-CM | POA: Diagnosis present

## 2015-10-08 DIAGNOSIS — Z96641 Presence of right artificial hip joint: Secondary | ICD-10-CM | POA: Diagnosis present

## 2015-10-08 LAB — COMPREHENSIVE METABOLIC PANEL
ALT: 19 U/L (ref 17–63)
ANION GAP: 11 (ref 5–15)
AST: 23 U/L (ref 15–41)
Albumin: 4.1 g/dL (ref 3.5–5.0)
Alkaline Phosphatase: 80 U/L (ref 38–126)
BUN: 21 mg/dL — ABNORMAL HIGH (ref 6–20)
CHLORIDE: 99 mmol/L — AB (ref 101–111)
CO2: 28 mmol/L (ref 22–32)
CREATININE: 1.38 mg/dL — AB (ref 0.61–1.24)
Calcium: 9.1 mg/dL (ref 8.9–10.3)
GFR, EST AFRICAN AMERICAN: 55 mL/min — AB (ref >60)
GFR, EST NON AFRICAN AMERICAN: 47 mL/min — AB (ref >60)
Glucose, Bld: 159 mg/dL — ABNORMAL HIGH (ref 65–99)
POTASSIUM: 4 mmol/L (ref 3.5–5.1)
SODIUM: 138 mmol/L (ref 135–145)
Total Bilirubin: 1.3 mg/dL — ABNORMAL HIGH (ref 0.3–1.2)
Total Protein: 7.8 g/dL (ref 6.5–8.1)

## 2015-10-08 LAB — I-STAT TROPONIN, ED: TROPONIN I, POC: 0 ng/mL (ref 0.00–0.08)

## 2015-10-08 LAB — URINALYSIS, ROUTINE W REFLEX MICROSCOPIC
Glucose, UA: NEGATIVE mg/dL
HGB URINE DIPSTICK: NEGATIVE
Ketones, ur: NEGATIVE mg/dL
Leukocytes, UA: NEGATIVE
Nitrite: NEGATIVE
PROTEIN: 100 mg/dL — AB
Specific Gravity, Urine: 1.019 (ref 1.005–1.030)
pH: 5 (ref 5.0–8.0)

## 2015-10-08 LAB — CBC WITH DIFFERENTIAL/PLATELET
Basophils Absolute: 0 10*3/uL (ref 0.0–0.1)
Basophils Relative: 0 %
EOS ABS: 0 10*3/uL (ref 0.0–0.7)
Eosinophils Relative: 0 %
HEMATOCRIT: 41.7 % (ref 39.0–52.0)
HEMOGLOBIN: 13.8 g/dL (ref 13.0–17.0)
LYMPHS ABS: 1.7 10*3/uL (ref 0.7–4.0)
LYMPHS PCT: 10 %
MCH: 30.1 pg (ref 26.0–34.0)
MCHC: 33.1 g/dL (ref 30.0–36.0)
MCV: 91 fL (ref 78.0–100.0)
Monocytes Absolute: 1.8 10*3/uL — ABNORMAL HIGH (ref 0.1–1.0)
Monocytes Relative: 10 %
NEUTROS ABS: 13.5 10*3/uL — AB (ref 1.7–7.7)
NEUTROS PCT: 80 %
Platelets: 245 10*3/uL (ref 150–400)
RBC: 4.58 MIL/uL (ref 4.22–5.81)
RDW: 12.7 % (ref 11.5–15.5)
WBC: 17.1 10*3/uL — AB (ref 4.0–10.5)

## 2015-10-08 LAB — URINE MICROSCOPIC-ADD ON

## 2015-10-08 LAB — I-STAT CG4 LACTIC ACID, ED: Lactic Acid, Venous: 1.38 mmol/L (ref 0.5–2.0)

## 2015-10-08 MED ORDER — SODIUM CHLORIDE 0.9 % IV SOLN
INTRAVENOUS | Status: DC
  Administered 2015-10-08: via INTRAVENOUS

## 2015-10-08 MED ORDER — IPRATROPIUM-ALBUTEROL 0.5-2.5 (3) MG/3ML IN SOLN
3.0000 mL | RESPIRATORY_TRACT | Status: DC
  Administered 2015-10-09 – 2015-10-10 (×9): 3 mL via RESPIRATORY_TRACT
  Filled 2015-10-08 (×9): qty 3

## 2015-10-08 MED ORDER — ALUM & MAG HYDROXIDE-SIMETH 200-200-20 MG/5ML PO SUSP: 30.0000 mL | Freq: Four times a day (QID) | ORAL | Status: DC | PRN

## 2015-10-08 MED ORDER — ACETAMINOPHEN 325 MG PO TABS: 650.0000 mg | ORAL_TABLET | Freq: Four times a day (QID) | ORAL | Status: DC | PRN

## 2015-10-08 MED ORDER — PANTOPRAZOLE SODIUM 40 MG PO TBEC
40.0000 mg | DELAYED_RELEASE_TABLET | Freq: Every day | ORAL | Status: DC
  Administered 2015-10-09 – 2015-10-10 (×2): 40 mg via ORAL
  Filled 2015-10-08 (×2): qty 1

## 2015-10-08 MED ORDER — DEXTROSE 5 % IV SOLN
500.0000 mg | INTRAVENOUS | Status: DC
  Administered 2015-10-09: 500 mg via INTRAVENOUS
  Filled 2015-10-08: qty 500

## 2015-10-08 MED ORDER — ASPIRIN EC 325 MG PO TBEC
325.0000 mg | DELAYED_RELEASE_TABLET | Freq: Every day | ORAL | Status: DC
  Administered 2015-10-09 – 2015-10-10 (×2): 325 mg via ORAL
  Filled 2015-10-08 (×3): qty 1

## 2015-10-08 MED ORDER — METHYLPREDNISOLONE SODIUM SUCC 125 MG IJ SOLR
60.0000 mg | Freq: Once | INTRAMUSCULAR | Status: AC
  Administered 2015-10-08: 60 mg via INTRAVENOUS
  Filled 2015-10-08: qty 0.96

## 2015-10-08 MED ORDER — FELODIPINE ER 10 MG PO TB24
10.0000 mg | ORAL_TABLET | Freq: Every day | ORAL | Status: DC
  Administered 2015-10-09 – 2015-10-10 (×2): 10 mg via ORAL
  Filled 2015-10-08 (×2): qty 1

## 2015-10-08 MED ORDER — IPRATROPIUM-ALBUTEROL 0.5-2.5 (3) MG/3ML IN SOLN
3.0000 mL | Freq: Once | RESPIRATORY_TRACT | Status: AC
  Administered 2015-10-08: 3 mL via RESPIRATORY_TRACT
  Filled 2015-10-08: qty 3

## 2015-10-08 MED ORDER — ADULT MULTIVITAMIN W/MINERALS CH
1.0000 | ORAL_TABLET | Freq: Every day | ORAL | Status: DC
  Filled 2015-10-08: qty 1

## 2015-10-08 MED ORDER — ENOXAPARIN SODIUM 40 MG/0.4ML ~~LOC~~ SOLN
40.0000 mg | Freq: Every day | SUBCUTANEOUS | Status: DC
  Administered 2015-10-08 – 2015-10-09 (×2): 40 mg via SUBCUTANEOUS
  Filled 2015-10-08 (×3): qty 0.4

## 2015-10-08 MED ORDER — METHYLPREDNISOLONE SODIUM SUCC 125 MG IJ SOLR
125.0000 mg | Freq: Once | INTRAMUSCULAR | Status: AC
  Administered 2015-10-09: 125 mg via INTRAVENOUS
  Filled 2015-10-08: qty 2

## 2015-10-08 MED ORDER — VITAMIN D 1000 UNITS PO TABS
2000.0000 [IU] | ORAL_TABLET | Freq: Every day | ORAL | Status: DC
  Administered 2015-10-09 – 2015-10-10 (×2): 2000 [IU] via ORAL
  Filled 2015-10-08 (×2): qty 2

## 2015-10-08 MED ORDER — LOSARTAN POTASSIUM-HCTZ 100-12.5 MG PO TABS: 1.0000 | ORAL_TABLET | Freq: Every day | ORAL | Status: DC

## 2015-10-08 MED ORDER — SODIUM CHLORIDE 0.9 % IV BOLUS (SEPSIS)
20.0000 mL/kg | Freq: Once | INTRAVENOUS | Status: AC
  Administered 2015-10-08: 1772 mL via INTRAVENOUS

## 2015-10-08 MED ORDER — DEXTROSE 5 % IV SOLN
500.0000 mg | Freq: Once | INTRAVENOUS | Status: AC
  Administered 2015-10-08: 500 mg via INTRAVENOUS
  Filled 2015-10-08: qty 500

## 2015-10-08 MED ORDER — FUROSEMIDE 20 MG PO TABS
20.0000 mg | ORAL_TABLET | Freq: Every day | ORAL | Status: DC
  Filled 2015-10-08: qty 1

## 2015-10-08 MED ORDER — HYDROCHLOROTHIAZIDE 12.5 MG PO CAPS
12.5000 mg | ORAL_CAPSULE | Freq: Every day | ORAL | Status: DC
  Filled 2015-10-08: qty 1

## 2015-10-08 MED ORDER — ONDANSETRON HCL 4 MG PO TABS: 4.0000 mg | ORAL_TABLET | Freq: Four times a day (QID) | ORAL | Status: DC | PRN

## 2015-10-08 MED ORDER — DEXTROSE 5 % IV SOLN
2.0000 g | INTRAVENOUS | Status: DC
  Administered 2015-10-09 – 2015-10-10 (×2): 2 g via INTRAVENOUS
  Filled 2015-10-08 (×2): qty 2

## 2015-10-08 MED ORDER — CEFTRIAXONE SODIUM 1 G IJ SOLR
1.0000 g | Freq: Once | INTRAMUSCULAR | Status: AC
  Administered 2015-10-08: 1 g via INTRAVENOUS
  Filled 2015-10-08: qty 10

## 2015-10-08 MED ORDER — ACETAMINOPHEN 650 MG RE SUPP: 650.0000 mg | Freq: Four times a day (QID) | RECTAL | Status: DC | PRN

## 2015-10-08 MED ORDER — ONDANSETRON HCL 4 MG/2ML IJ SOLN: 4.0000 mg | Freq: Four times a day (QID) | INTRAMUSCULAR | Status: DC | PRN

## 2015-10-08 MED ORDER — DM-GUAIFENESIN ER 30-600 MG PO TB12
1.0000 | ORAL_TABLET | Freq: Every day | ORAL | Status: DC | PRN
  Filled 2015-10-08 (×2): qty 1

## 2015-10-08 MED ORDER — HYDROMORPHONE HCL 1 MG/ML IJ SOLN: 0.5000 mg | INTRAMUSCULAR | Status: DC | PRN

## 2015-10-08 MED ORDER — LOSARTAN POTASSIUM 50 MG PO TABS
100.0000 mg | ORAL_TABLET | Freq: Every day | ORAL | Status: DC
  Administered 2015-10-09 – 2015-10-10 (×2): 100 mg via ORAL
  Filled 2015-10-08 (×2): qty 2

## 2015-10-08 MED ORDER — ATORVASTATIN CALCIUM 40 MG PO TABS
40.0000 mg | ORAL_TABLET | Freq: Every day | ORAL | Status: DC
  Administered 2015-10-09: 40 mg via ORAL
  Filled 2015-10-08 (×2): qty 1

## 2015-10-08 MED ORDER — PREDNISONE 20 MG PO TABS
60.0000 mg | ORAL_TABLET | Freq: Once | ORAL | Status: AC
  Administered 2015-10-08: 60 mg via ORAL
  Filled 2015-10-08: qty 3

## 2015-10-08 MED ORDER — DEXTROSE 5 % IV SOLN: 1.0000 g | INTRAVENOUS | Status: DC

## 2015-10-08 NOTE — Patient Instructions (Signed)
Go to ER immediately.

## 2015-10-08 NOTE — H&P (Signed)
[facility] Admission History and Physical       Sean Lin [id number] DOB: January 03, 1935 DOA: 10/08/2015  Referring physician: EDP PCP: Gilford Rile, MD  Specialists:   Chief Complaint: Fever Chills Cough and Wheezing  HPI: Sean Lin is a 80 y.o. male with a history of COPD, CAD, HTN, NIDDM, Hyperlipidemia who presents to the ED after being seen by his pulmonologist during the day and referred to the ED.  He had been having URI Symptoms of fevers, chills, nasal congestion sore throat and productive cough x 2 days.  He and his wife report that their granddaughter was sick with cold symptoms this week.   Patient denies SOB, but was found to have wheezing and hypoxia to 88% in the doctor's office and was placed on 2 liters NCO2.   He was sent to the ED and was evaluated and had a chest x-ray which was negative for acute findings.  He was administered Prednisone, and Duonebs, and referred for medical admission.    Review of Systems:  Constitutional: No Weight Loss, No Weight Gain, Night Sweats, +Fevers, +Chills, Dizziness, Light Headedness, Fatigue, or Generalized Weakness HEENT: No Headaches, Difficulty Swallowing,Tooth/Dental Problems,Sore Throat,  No Sneezing, Rhinitis, Ear Ache, Nasal Congestion, or Post Nasal Drip,  Cardio-vascular:  No Chest pain, Orthopnea, PND, Edema in Lower Extremities, Anasarca, Dizziness, Palpitations  Resp:  +Dyspnea, No DOE, +Productive Cough, No Non-Productive Cough, No Hemoptysis,  +Wheezing.    GI: No Heartburn, Indigestion, Abdominal Pain, +Nausea, Vomiting, Diarrhea, Constipation, Hematemesis, Hematochezia, Melena, Change in Bowel Habits,  Loss of Appetite  GU: No Dysuria, No Change in Color of Urine, No Urgency or Urinary Frequency, No Flank pain.  Musculoskeletal: No Joint Pain or Swelling, No Decreased Range of Motion, No Back Pain.  Neurologic: No Syncope, No Seizures, Muscle Weakness, Paresthesia, Vision Disturbance or Loss, No  Diplopia, No Vertigo, No Difficulty Walking,  Skin: No Rash or Lesions. Psych: No Change in Mood or Affect, No Depression or Anxiety, No Memory loss, No Confusion, or Hallucinations   Past Medical History  Diagnosis Date  . CAD (coronary artery disease)     A. 03/21/97: s/p CABG x4 (pvt) - [facility] - LAD; SVG -DIAG; SVG-OM; SVG-RCA B. 10/04/2008: EX. MV - EX. TIME 5:15; MAX HR 155; EF 65%; LOW RISK STUDY WITH NL     . Depression   . HTN (hypertension)   . HLD (hyperlipidemia)   . Cerebrovascular disease   . Prostate cancer ([place])     hx  . [place] hemorrhagic fever     pending r tha ??     Past Surgical History  Procedure Laterality Date  . Coronary artery bypass graft  1990 or 1991  . Rotator cuff repair--left side    . Prostate surgery  2004  . Right hip replacement        Prior to Admission medications   Medication Sig Start Date End Date Taking? Authorizing Provider  albuterol (VENTOLIN HFA) 108 (90 BASE) MCG/ACT inhaler Inhale 2 puffs into the lungs 2 (two) times daily.     Yes Historical Provider, MD  aspirin EC 325 MG tablet Take 325 mg by mouth daily.     Yes Historical Provider, MD  atorvastatin (LIPITOR) 40 MG tablet Take 40 mg by mouth daily.   Yes Historical Provider, MD  Cholecalciferol (VITAMIN D) 2000 UNITS CAPS Take 1 capsule by mouth daily.   Yes Historical Provider, MD  dextromethorphan-guaiFENesin (MUCINEX DM) 30-600 MG 12hr tablet Take 1 tablet  by mouth daily as needed for cough.   Yes Historical Provider, MD  felodipine (PLENDIL) 10 MG 24 hr tablet Take 10 mg by mouth daily.     Yes Historical Provider, MD  furosemide (LASIX) 20 MG tablet TAKE ONE TABLET EVERY DAY 06/28/13  Yes Lelon Perla, MD  glimepiride (AMARYL) 1 MG tablet Take 1 tablet by mouth daily. 09/19/15  Yes Historical Provider, MD  losartan-hydrochlorothiazide (HYZAAR) 100-12.5 MG per tablet Take 1 tablet by mouth daily.     Yes Historical Provider, MD  Multiple Vitamins-Minerals (MULTIVITAL) tablet  Take 1 tablet by mouth daily.     Yes Historical Provider, MD  neomycin-bacitracin-polymyxin (NEOSPORIN) OINT Apply 1 application topically daily.   Yes Historical Provider, MD  omeprazole (PRILOSEC OTC) 20 MG tablet Take 20 mg by mouth daily.     Yes Historical Provider, MD  SYMBICORT 160-4.5 MCG/ACT inhaler INHALE 2 PUFFS BY MOUTH 2 TIMES DAILY 06/20/15  Yes Collene Gobble, MD  traMADol (ULTRAM) 50 MG tablet Take 100 mg by mouth 3 (three) times daily as needed.    Yes Historical Provider, MD  triamcinolone (NASACORT ALLERGY 24HR) 55 MCG/ACT AERO nasal inhaler Place 2 sprays into the nose daily as needed (allergies).   Yes Historical Provider, MD  Respiratory Therapy Supplies (FLUTTER) DEVI Use as directed. 02/22/12   Melvenia Needles, NP     Allergies  Allergen Reactions  . Codeine     nausea  . Hydrocodone     REACTION: nausea  . Levofloxacin     REACTION: nausea    Social History:  reports that he quit smoking about 31 years ago. His smoking use included Cigarettes. He has a 7.5 pack-year smoking history. He has never used smokeless tobacco. He reports that he does not drink alcohol or use illicit drugs.    Family History  Problem Relation Age of Onset  . Arthritis Mother   . Kidney cancer Father   . Lymphoma Son     Non-Hodgkin's       Physical Exam:  GEN:  Pleasant Obese Elderly  80 y.o. Caucasian male examined and in no acute distress; cooperative with exam Filed Vitals:   10/08/15 2000 10/08/15 2030 10/08/15 2105 10/08/15 2120  BP: 148/74 136/57  122/66  Pulse: 94 92  91  Temp:   98.4 F (36.9 C) 98.6 F (37 C)  TempSrc:   Oral Oral  Resp: 20 19  20   Height:    5\' 9"  (1.753 m)  Weight:    88 kg (194 lb 0.1 oz)  SpO2: 93% 97%  96%   Blood pressure 122/66, pulse 91, temperature 98.6 F (37 C), temperature source Oral, resp. rate 20, height 5\' 9"  (1.753 m), weight 88 kg (194 lb 0.1 oz), SpO2 96 %. PSYCH: He is alert and oriented x4; does not appear anxious does not  appear depressed; affect is normal HEENT: Normocephalic and Atraumatic, Mucous membranes pink; PERRLA; EOM intact; Fundi:  Benign;  No scleral icterus, Nares: Patent, Oropharynx: Clear, Edentulous with Dentures Present,    Neck:  FROM, No Cervical Lymphadenopathy nor Thyromegaly or Carotid Bruit; No JVD; Breasts:: Not examined CHEST WALL: No tenderness CHEST: Normal respiration, clear to auscultation bilaterally HEART: Regular rate and rhythm; no murmurs rubs or gallops BACK: No kyphosis or scoliosis; No CVA tenderness ABDOMEN: Positive Bowel Sounds, Obese, Soft Non-Tender, No Rebound or Guarding; No Masses, No Organomegaly. Rectal Exam: Not done EXTREMITIES: No Cyanosis, Clubbing, or Edema; No Ulcerations. Genitalia: not  examined PULSES: 2+ and symmetric SKIN: Normal hydration no rash or ulceration CNS:  Alert and Oriented x 4, No Focal Deficits except +HOH Vascular: pulses palpable throughout    Labs on Admission:  Basic Metabolic Panel:  Recent Labs Lab 10/08/15 1803  NA 138  K 4.0  CL 99*  CO2 28  GLUCOSE 159*  BUN 21*  CREATININE 1.38*  CALCIUM 9.1   Liver Function Tests:  Recent Labs Lab 10/08/15 1803  AST 23  ALT 19  ALKPHOS 80  BILITOT 1.3*  PROT 7.8  ALBUMIN 4.1   No results for input(s): LIPASE, AMYLASE in the last 168 hours. No results for input(s): AMMONIA in the last 168 hours. CBC:  Recent Labs Lab 10/08/15 1803  WBC 17.1*  NEUTROABS 13.5*  HGB 13.8  HCT 41.7  MCV 91.0  PLT 245   Cardiac Enzymes: No results for input(s): CKTOTAL, CKMB, CKMBINDEX, TROPONINI in the last 168 hours.  BNP (last 3 results) No results for input(s): BNP in the last 8760 hours.  ProBNP (last 3 results) No results for input(s): PROBNP in the last 8760 hours.  CBG: No results for input(s): GLUCAP in the last 168 hours.  Radiological Exams on Admission: Dg Chest 2 View  10/08/2015  CLINICAL DATA:  Hypoxia and fever. EXAM: CHEST  2 VIEW COMPARISON:  12/20/2014  FINDINGS: The heart size and mediastinal contours are within normal limits. Chronic asymmetric elevation of right hemidiaphragm is again noted. No pleural effusion or edema. No airspace consolidation. Both lungs are clear. The visualized skeletal structures are unremarkable. IMPRESSION: 1. No acute cardiopulmonary abnormalities. 2. Chronic asymmetric elevation of right hemidiaphragm. Electronically Signed   By: Kerby Moors M.D.   On: 10/08/2015 18:02     Assessment/Plan:       80 y.o. male with  Principal Problem:   1.     COPD exacerbation (HCC)   Steroid taper   DuoNebs   O2   Active Problems:   2.     Acute respiratory failure with hypoxia (HCC)   NCO2 PRN   Monitor O2 sats     3.    Acute bronchitis- versus Early CAP   IV Rocephin and Azithromycin     4.    Hyperlipidemia   Continue Atorvastatin Rx     5.    Essential hypertension   Continue Lasix, Losartan /HCTZ, and      6.    CAD (coronary artery disease)   Continue Atorvastatin Rx     7.    NIDDM   On Glimepiride on Hold   SSI coverage PRN   Check HbA1C in AM     8.    Leukocytosis- due to #2   Monitor Trend     9.    DVT Prophylaxis   Lovenox   Code Status:     FULL CODE Family Communication:   Wife at Bedside      Disposition Plan:    Inpatient Status        Time spent:  47 Minutes      Theressa Millard [facility] Pager [phone]   If [place] Please Contact the Day Rounding Team MD for [facility]  If 7PM-7AM, Please Contact Night-Floor Coverage  [email or web address] Password [email or web address] 10/08/2015, 10:07 PM     ADDENDUM:   Patient was seen and examined on 10/08/2015

## 2015-10-08 NOTE — ED Provider Notes (Signed)
CSN: [id number]     Arrival date & time 10/08/15  1707 History   First MD Initiated Contact with Patient 10/08/15 1711     Chief Complaint  Patient presents with  . Fever    HPI Pt has history of CAP and former smoking history. Over the last few days he started having trouble with cough, congestion, sore throat.  His grandchildren have been sick.   Pt was seen at [facility] office today for trouble with cough and congestion.  Denies shortness of breath.  No diarrhea or vomiting.  Some nausea this am.  In the office he was noted to be somewhat confused with abnormal lung sounds.  He was sent to the ED for further evaluation. Past Medical History  Diagnosis Date  . CAD (coronary artery disease)     A. 03/21/97: s/p CABG x4 (pvt) - [place] - LAD; SVG -DIAG; SVG-OM; SVG-RCA B. 10/04/2008: EX. MV - EX. TIME 5:15; MAX HR 155; EF 65%; LOW RISK STUDY WITH NL     . Depression   . HTN (hypertension)   . HLD (hyperlipidemia)   . Cerebrovascular disease   . Prostate cancer ([place])     hx  . [place] hemorrhagic fever     pending r tha ??   Past Surgical History  Procedure Laterality Date  . Coronary artery bypass graft  1990 or 1991  . Rotator cuff repair--left side    . Prostate surgery  2004  . Right hip replacement     Family History  Problem Relation Age of Onset  . Arthritis Mother   . Kidney cancer Father   . Lymphoma Son     Non-Hodgkin's   Social History  Substance Use Topics  . Smoking status: Former Smoker -- 0.50 packs/day for 15 years    Types: Cigarettes    Quit date: 11/02/1983  . Smokeless tobacco: Never Used     Comment: quit in 1989. Smoked for 40 years, up to two packs a day   . Alcohol Use: No    Review of Systems  Musculoskeletal:       Knee pain  All other systems reviewed and are negative.     Allergies  Codeine; Hydrocodone; and Levofloxacin  Home Medications   Prior to Admission medications   Medication Sig Start Date End Date Taking? Authorizing Provider   albuterol (VENTOLIN HFA) 108 (90 BASE) MCG/ACT inhaler Inhale 2 puffs into the lungs 2 (two) times daily.     Yes Historical Provider, MD  aspirin EC 325 MG tablet Take 325 mg by mouth daily.     Yes Historical Provider, MD  atorvastatin (LIPITOR) 40 MG tablet Take 40 mg by mouth daily.   Yes Historical Provider, MD  Cholecalciferol (VITAMIN D) 2000 UNITS CAPS Take 1 capsule by mouth daily.   Yes Historical Provider, MD  dextromethorphan-guaiFENesin (MUCINEX DM) 30-600 MG 12hr tablet Take 1 tablet by mouth daily as needed for cough.   Yes Historical Provider, MD  felodipine (PLENDIL) 10 MG 24 hr tablet Take 10 mg by mouth daily.     Yes Historical Provider, MD  furosemide (LASIX) 20 MG tablet TAKE ONE TABLET EVERY DAY 06/28/13  Yes Lelon Perla, MD  glimepiride (AMARYL) 1 MG tablet Take 1 tablet by mouth daily. 09/19/15  Yes Historical Provider, MD  losartan-hydrochlorothiazide (HYZAAR) 100-12.5 MG per tablet Take 1 tablet by mouth daily.     Yes Historical Provider, MD  Multiple Vitamins-Minerals (MULTIVITAL) tablet Take 1 tablet  by mouth daily.     Yes Historical Provider, MD  neomycin-bacitracin-polymyxin (NEOSPORIN) OINT Apply 1 application topically daily.   Yes Historical Provider, MD  omeprazole (PRILOSEC OTC) 20 MG tablet Take 20 mg by mouth daily.     Yes Historical Provider, MD  SYMBICORT 160-4.5 MCG/ACT inhaler INHALE 2 PUFFS BY MOUTH 2 TIMES DAILY 06/20/15  Yes Collene Gobble, MD  traMADol (ULTRAM) 50 MG tablet Take 100 mg by mouth 3 (three) times daily as needed.    Yes Historical Provider, MD  triamcinolone (NASACORT ALLERGY 24HR) 55 MCG/ACT AERO nasal inhaler Place 2 sprays into the nose daily as needed (allergies).   Yes Historical Provider, MD  Respiratory Therapy Supplies (FLUTTER) DEVI Use as directed. 02/22/12   Tammy S Parrett, NP   BP 146/52 mmHg  Pulse 91  Temp(Src) 102.1 F (38.9 C) (Oral)  Resp 19  SpO2 92% Physical Exam  Constitutional: No distress.  HENT:   Head: Normocephalic and atraumatic.  Right Ear: External ear normal.  Left Ear: External ear normal.  Eyes: Conjunctivae are normal. Right eye exhibits no discharge. Left eye exhibits no discharge. No scleral icterus.  Neck: Neck supple. No tracheal deviation present.  Cardiovascular: Regular rhythm and intact distal pulses.  Tachycardia present.   Pulmonary/Chest: Effort normal. No stridor. No respiratory distress. He has wheezes. He has rhonchi. He has no rales.  Abdominal: Soft. Bowel sounds are normal. He exhibits no distension. There is no tenderness. There is no rebound and no guarding.  Musculoskeletal: He exhibits no edema or tenderness.  Neurological: He is alert. He has normal strength. No cranial nerve deficit (no facial droop, extraocular movements intact, no slurred speech) or sensory deficit. He exhibits normal muscle tone. He displays no seizure activity. Coordination normal.  Skin: Skin is warm and dry. No rash noted. He is not diaphoretic.  Psychiatric: He has a normal mood and affect.  Nursing note and vitals reviewed.   ED Course  Procedures (including critical care time) Labs Review Labs Reviewed  COMPREHENSIVE METABOLIC PANEL - Abnormal; Notable for the following:    Chloride 99 (*)    Glucose, Bld 159 (*)    BUN 21 (*)    Creatinine, Ser 1.38 (*)    Total Bilirubin 1.3 (*)    GFR calc non Af Amer 47 (*)    GFR calc Af Amer 55 (*)    All other components within normal limits  CBC WITH DIFFERENTIAL/PLATELET - Abnormal; Notable for the following:    WBC 17.1 (*)    Neutro Abs 13.5 (*)    Monocytes Absolute 1.8 (*)    All other components within normal limits  URINALYSIS, ROUTINE W REFLEX MICROSCOPIC (NOT AT [facility]) - Abnormal; Notable for the following:    APPearance HAZY (*)    Bilirubin Urine SMALL (*)    Protein, ur 100 (*)    All other components within normal limits  URINE MICROSCOPIC-ADD ON - Abnormal; Notable for the following:    Squamous Epithelial /  LPF 0-5 (*)    Bacteria, UA FEW (*)    Casts HYALINE CASTS (*)    All other components within normal limits  CULTURE, BLOOD (ROUTINE X 2)  CULTURE, BLOOD (ROUTINE X 2)  URINE CULTURE  I-STAT CG4 LACTIC ACID, ED  I-STAT TROPOININ, ED  I-STAT CG4 LACTIC ACID, ED    Imaging Review Dg Chest 2 View  10/08/2015  CLINICAL DATA:  Hypoxia and fever. EXAM: CHEST  2 VIEW COMPARISON:  12/20/2014 FINDINGS: The heart size and mediastinal contours are within normal limits. Chronic asymmetric elevation of right hemidiaphragm is again noted. No pleural effusion or edema. No airspace consolidation. Both lungs are clear. The visualized skeletal structures are unremarkable. IMPRESSION: 1. No acute cardiopulmonary abnormalities. 2. Chronic asymmetric elevation of right hemidiaphragm. Electronically Signed   By: Kerby Moors M.D.   On: 10/08/2015 18:02   I have personally reviewed and evaluated these images and lab results as part of my medical decision-making.   EKG Interpretation   Date/Time:  Wednesday October 08 2015 19:15:29 EST Ventricular Rate:  92 PR Interval:  197 QRS Duration: 92 QT Interval:  356 QTC Calculation: 440 R Axis:   70 Text Interpretation:  Sinus rhythm Since last tracing rate faster  Confirmed by Alise Calais  MD-J, Milka Windholz ([id number]) on 10/08/2015 8:13:28 PM     Medications  azithromycin (ZITHROMAX) 500 mg in dextrose 5 % 250 mL IVPB (500 mg Intravenous New Bag/Given 10/08/15 1950)  predniSONE (DELTASONE) tablet 60 mg (not administered)  sodium chloride 0.9 % bolus 1,772 mL (1,772 mLs Intravenous New Bag/Given 10/08/15 1833)  cefTRIAXone (ROCEPHIN) 1 g in dextrose 5 % 50 mL IVPB (0 g Intravenous Stopped 10/08/15 1950)  ipratropium-albuterol (DUONEB) 0.5-2.5 (3) MG/3ML nebulizer solution 3 mL (3 mLs Nebulization Given 10/08/15 1910)    MDM   Final diagnoses:  Chronic obstructive pulmonary disease with acute exacerbation (HCC)    Patient's symptoms are suggestive of a COPD exacerbation.   Chest x-ray does not show pneumonia. He had mild hypoxia and tachycardia associated with infection but I doubt sepsis despite his fever and leukocytosis. He remains alert and awake and is not confused. He is not hypotensive. His lactic acid level was normal.  Patient was initially started on antibiotics because of concerns of sepsis associated with pneumonia. Her well may just be a viral illness. We'll add on an influenza panel. He was treated with steroids and nebulizer treatments for the COPD.  Plan on admission for observation considering his age, leukocytosis and comorbidities.   Dorie Rank, MD 10/08/15 2021

## 2015-10-08 NOTE — ED Notes (Signed)
Pt, being sent by Pulmonologist, c/o hypoxia and fever (103).  Hx of asthma and paralyzed diaphragm.

## 2015-10-08 NOTE — Progress Notes (Signed)
Subjective:    Patient ID: Sean Lin, male    DOB: 05-11-1935, 80 y.o.   MRN: [id number] HPI 80 yo man, hx former tobacco (8 pk-yrs), CAD/CABG '91, HTN, DM, prostate CA. Tells me he was dx with asthma about 15 yrs ago, has been on Advair in past, also Maxair. Currently on QVAR + albuterol. Was dx with CAP in [place] approximately about a month ago. Started as URI symptoms that evolved to sinus drainage and then to chest congestion, cough.  CXR was performed that showed possible PNA, treated with Avelox + Pred.  Course c/b diarrhea.   PULMONARY FUNCTON TEST 10/11/2011  FVC 3.21  FEV1 1.28  FEV1/FVC 39.9  FVC  % Predicted 54  FEV % Predicted 47  FeF 25-75 0.54  FeF 25-75 % Predicted 2.43   ROV 11/05/11 -- follows up for his asthma. Last time we changed QVAR to Symbicort to see if he would benefit. He is better, has only needed SABA once since last time. No side effects, no problems tolerating. He wants to stay on it.   ROV 07/04/12 -- former tobacco (8 pk-yrs), CAD/CABG '91, HTN, DM, prostate CA. 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 Also R HD paralysis with associated restriction. He was admitted to [facility] 8/13 with an apparent exacerbation  - was started on a new inhaled med + symbicort + saba prn. He describes an episode where he had trouble getting choked on a pill, this led to a decompensation and asthma flare.   ROV 10/04/12 -- follows up for asthma and R HD paralysis.  Currently on Symbicort bid. Uses his albuterol prn - averages a few times a week. He had small scattered nodules on CT scan 02/22/12, repeat scan 11/13 shows no change as below. No exacerbations. Doing well.   ROV 05/30/13 -- follows up for asthma (severe AFL) and R HD paralysis. Currently on symbicort + SABA prn. He reports that he was treated with abx + pred x 2 this spring. For the last 2 months has been well. He was having more congestion, mucous, sputum at that time. He has been more active lately, working  some outside. Using albuterol 2x a day.   10/01/13 Acute OV  Complains of wheezing, chest congestion, occ prod cough with light gray mucus, some sinus congestion w/ yellow mucus, PND, DOE x5days - denies f/c/s, hemoptysis, nausea, vomiting.  Had CT chest on 09/17/13 that showed resolved nodules.  Did show incidental right renal lesion ~1.6cm.  Patient denies any hemoptysis, orthopnea, PND, leg swelling, or weight loss. Patient is followed by urology for kidney stones.  Patient remains on Symbicort 2 puffs twice daily, along with Atrovent 2 puffs 4 times daily.  12/14/13 Follow up  RB pt here for 2 month Asthma follow up - reports breathing is doing well overall, no new complaints. Remains on Symbicort 2 puffs twice daily. Patient denies any chest pain, hemoptysis, orthopnea, PND, or leg swelling  ROV 02/22/14 -- follows up for asthma, severe AFL, R HD paralysis. Also w resolved pulm nodules by CT 09/17/13. He is using symbicort.  Uses ventolin bid also.  He had some increased congestion late March, no overt exacerbations.   07/26/14 Acute OV  Hx of Asthma, severe AFL , R. HD paralysis  Pt complains of  increased SOB, wheezing, tightness, prod cough with gray/green mucus x1 week.   Denies any f/c/s, n/v/d, hemoptysis, PND, leg swelling On Symbicort Twice daily .  Using mucinex with  some help.  No recent travel or abx use.   ROV 08/01/14 -- follows for hx asthma, severe AFL, R HD paralysis. He was just seen by TP on 9/25 for an apparent bronchitis / AE. He was having a lot of cough, throat clearing. Some mucous. He is on symbicort, seems to be tolerating it.  Uses ventolin every day on a schedule.   12/20/2014 Acute OV :Severe Asthma /R HD paralysis  Patient presents for an acute office visit. He complains of a productive cough with thick green mucus, congestion, shortness of breath and wheezing.. Symptoms started  3 weeks ago. He was called in a Z-Pak and prednisone without much relief. He was  then called in another prednisone pack. However, he has not seen significant decrease in his congestion. He continues to cough up thick yellow-green mucus. He denies any hemoptysis, orthopnea, PND, or increased leg swelling. rec Augmentin 875 mg twice daily for 7 days, take with food Mucinex DM twice daily as needed for cough and congestion  Continue with fluids and rest Follow Dr. Lamonte Sakai in today 6 weeks and As needed     10/08/2015 acute extended ov/[name] re: ams/ fever/cough / sob in pt with paralyzed R HD/ severe asthma  Chief Complaint  Patient presents with  . Acute Visit    Pt c/o congestion and cough for the past 2 days.  Cough is prod with clear sputum.  He has also noticed wheezing and increased SOB.    pt hard of hearing so difficult hx but wife notes he's not responding normally even to her, congested cough with rattling and increased wob not responding to saba and poor po intake - not sure what sugars are running   No obvious day to day or daytime variability or assoc  cp or chest tightness,   or overt sinus or hb symptoms. No unusual exp hx or h/o childhood pna/ asthma or knowledge of premature birth.  Sleeping ok without nocturnal  or early am exacerbation  of respiratory  c/o's or need for noct saba. Also denies any obvious fluctuation of symptoms with weather or environmental changes or other aggravating or alleviating factors except as outlined above   Current Medications, Allergies, Complete Past Medical History, Past Surgical History, Family History, and Social History were reviewed in [employer] record.  ROS  The following are not active complaints unless bolded sore throat, dysphagia, dental problems, itching, sneezing,  nasal congestion or excess/ purulent secretions, ear ache,   fever, chills, sweats, unintended wt loss, classically pleuritic or exertional cp, hemoptysis,  orthopnea pnd or leg swelling, presyncope, palpitations, abdominal pain,  anorexia, nausea, vomiting, diarrhea  or change in bowel or bladder habits, change in stools or urine, dysuria,hematuria,  rash, arthralgias, visual complaints, headache, numbness, weakness or ataxia or problems with walking or coordination,  change in mood/affect or memory.                               Objective:   Physical Exam    Gen: obese wm / 2 person assist to stand and get on exam table, seems out of it  ENT: No lesions,  mouth clear,  oropharynx clear, no postnasal drip, some hoarse voice  Neck: No JVD, no TMG, no carotid bruits  Lungs: No use of accessory muscles, very junky insp and exp rhonchi   Cardiovascular: RRR, heart sounds normal, no murmur or gallops, no peripheral edema  Musculoskeletal: No deformities, no cyanosis or clubbing  Neuro: alert, non focal  Skin: Warm, no lesions or rashes   Sniff Test 02/21/12:  IMPRESSION:  Elevation of the right hemidiaphragm with associated diaphragmatic  Paralysis              Assessment & Plan:

## 2015-10-08 NOTE — Assessment & Plan Note (Signed)
In setting of fever/ cough/ congestion with ams = cap with possible sepsis until proven otherwise > to ER asap ER triage notified but ok to go across the street per wife's transportation

## 2015-10-08 NOTE — ED Notes (Signed)
Saw Dr. Melvyn Novas today (normally sees Dr. Lamonte Sakai) for fever/cough/shob and a couple of episodes of n/v this morning.  He is shob and in no distress.  His wife and son are with him.

## 2015-10-09 ENCOUNTER — Encounter (HOSPITAL_COMMUNITY): Payer: Self-pay | Admitting: *Deleted

## 2015-10-09 DIAGNOSIS — E118 Type 2 diabetes mellitus with unspecified complications: Secondary | ICD-10-CM

## 2015-10-09 DIAGNOSIS — I2510 Atherosclerotic heart disease of native coronary artery without angina pectoris: Secondary | ICD-10-CM

## 2015-10-09 DIAGNOSIS — A419 Sepsis, unspecified organism: Principal | ICD-10-CM

## 2015-10-09 DIAGNOSIS — J9601 Acute respiratory failure with hypoxia: Secondary | ICD-10-CM

## 2015-10-09 DIAGNOSIS — D72829 Elevated white blood cell count, unspecified: Secondary | ICD-10-CM

## 2015-10-09 DIAGNOSIS — E119 Type 2 diabetes mellitus without complications: Secondary | ICD-10-CM

## 2015-10-09 DIAGNOSIS — I10 Essential (primary) hypertension: Secondary | ICD-10-CM

## 2015-10-09 DIAGNOSIS — J209 Acute bronchitis, unspecified: Secondary | ICD-10-CM

## 2015-10-09 DIAGNOSIS — J4541 Moderate persistent asthma with (acute) exacerbation: Secondary | ICD-10-CM

## 2015-10-09 DIAGNOSIS — E785 Hyperlipidemia, unspecified: Secondary | ICD-10-CM

## 2015-10-09 DIAGNOSIS — J986 Disorders of diaphragm: Secondary | ICD-10-CM

## 2015-10-09 LAB — INFLUENZA PANEL BY PCR (TYPE A & B)
H1N1FLUPCR: NOT DETECTED
Influenza A By PCR: NEGATIVE
Influenza B By PCR: NEGATIVE

## 2015-10-09 LAB — BASIC METABOLIC PANEL
ANION GAP: 9 (ref 5–15)
BUN: 21 mg/dL — AB (ref 6–20)
CALCIUM: 8.1 mg/dL — AB (ref 8.9–10.3)
CO2: 25 mmol/L (ref 22–32)
Chloride: 101 mmol/L (ref 101–111)
Creatinine, Ser: 1.19 mg/dL (ref 0.61–1.24)
GFR calc Af Amer: >60 mL/min (ref >60)
GFR, EST NON AFRICAN AMERICAN: 57 mL/min — AB (ref >60)
GLUCOSE: 226 mg/dL — AB (ref 65–99)
POTASSIUM: 4 mmol/L (ref 3.5–5.1)
Sodium: 135 mmol/L (ref 135–145)

## 2015-10-09 LAB — CBC
HEMATOCRIT: 38.4 % — AB (ref 39.0–52.0)
HEMOGLOBIN: 12.4 g/dL — AB (ref 13.0–17.0)
MCH: 29.5 pg (ref 26.0–34.0)
MCHC: 32.3 g/dL (ref 30.0–36.0)
MCV: 91.2 fL (ref 78.0–100.0)
Platelets: 212 10*3/uL (ref 150–400)
RBC: 4.21 MIL/uL — ABNORMAL LOW (ref 4.22–5.81)
RDW: 12.4 % (ref 11.5–15.5)
WBC: 12.6 10*3/uL — ABNORMAL HIGH (ref 4.0–10.5)

## 2015-10-09 LAB — GLUCOSE, CAPILLARY
GLUCOSE-CAPILLARY: 318 mg/dL — AB (ref 65–99)
Glucose-Capillary: 256 mg/dL — ABNORMAL HIGH (ref 65–99)
Glucose-Capillary: 255 mg/dL — ABNORMAL HIGH (ref 65–99)
Glucose-Capillary: 320 mg/dL — ABNORMAL HIGH (ref 65–99)

## 2015-10-09 MED ORDER — INSULIN ASPART 100 UNIT/ML ~~LOC~~ SOLN
0.0000 [IU] | Freq: Every day | SUBCUTANEOUS | Status: DC
  Administered 2015-10-09: 4 [IU] via SUBCUTANEOUS

## 2015-10-09 MED ORDER — TRAMADOL HCL 50 MG PO TABS
100.0000 mg | ORAL_TABLET | Freq: Three times a day (TID) | ORAL | Status: DC | PRN
  Administered 2015-10-09 – 2015-10-10 (×2): 100 mg via ORAL
  Filled 2015-10-09 (×2): qty 2

## 2015-10-09 MED ORDER — METHYLPREDNISOLONE SODIUM SUCC 125 MG IJ SOLR
60.0000 mg | Freq: Two times a day (BID) | INTRAMUSCULAR | Status: DC
  Administered 2015-10-09: 60 mg via INTRAVENOUS
  Filled 2015-10-09 (×3): qty 0.96

## 2015-10-09 MED ORDER — GUAIFENESIN ER 600 MG PO TB12
600.0000 mg | ORAL_TABLET | Freq: Two times a day (BID) | ORAL | Status: DC
  Administered 2015-10-09 – 2015-10-10 (×3): 600 mg via ORAL
  Filled 2015-10-09 (×4): qty 1

## 2015-10-09 MED ORDER — BUDESONIDE-FORMOTEROL FUMARATE 160-4.5 MCG/ACT IN AERO
2.0000 | INHALATION_SPRAY | Freq: Two times a day (BID) | RESPIRATORY_TRACT | Status: DC
  Administered 2015-10-09 – 2015-10-10 (×2): 2 via RESPIRATORY_TRACT
  Filled 2015-10-09: qty 6

## 2015-10-09 MED ORDER — METHYLPREDNISOLONE SODIUM SUCC 125 MG IJ SOLR
60.0000 mg | Freq: Four times a day (QID) | INTRAMUSCULAR | Status: DC
  Administered 2015-10-09: 60 mg via INTRAVENOUS
  Filled 2015-10-09 (×5): qty 0.96

## 2015-10-09 MED ORDER — INSULIN ASPART 100 UNIT/ML ~~LOC~~ SOLN
0.0000 [IU] | Freq: Three times a day (TID) | SUBCUTANEOUS | Status: DC
  Administered 2015-10-09 (×2): 8 [IU] via SUBCUTANEOUS
  Administered 2015-10-09 – 2015-10-10 (×2): 11 [IU] via SUBCUTANEOUS

## 2015-10-09 NOTE — Progress Notes (Signed)
[facility] Progress Note    Patient: Sean Lin    [id number]  DOB: 1935/09/28     DOA: 10/08/2015 Date of Service: the patient was seen and examined on 10/09/2015 Day 1  of admission.  Subjective: Patient presented to pulmonary clinic with the complaints of fever as well as cough as well as worsening shortness of breath with hypoxia,T MAX of 102.1, requiring 2 L of oxygen. Currently mentions feeling better but not at his baseline, appears in respiratory distress. Patient was willing to leave [place] for unknown reason, but patient's family intervened and the patient agreed to stay.  Nutrition: Able to tolerate oral diet Activity: Was bedridden throughout the night Last BM: 10/06/2015  Assessment and Plan: 1. Acute respiratory failure with hypoxia (HCC)  Acute bronchitis with sepsis Acute exacerbation of chronic asthma without status asthmaticus  Patient presented with complaints of worsening cough shortness of breath as well as fever. With fever, tachycardia, tachypnea, hypoxia, leukocytosis patient meeting criteria for sepsis. At present 91% on room air at rest but in significant respiratory distress. Sitting continues to have bilateral wheezing. Recommend to continue every 4 hours DuoNeb, Solu-Medrol every 12 hours, influenza PCR is negative. Also continue ceftriaxone and azithromycin. Check saturation on ambulation  2. Type 2 diabetes mellitus. Holding oral hypoglycemic agent placing the patient on sliding scale insulin.  3. Dyslipidemia. Continue home medication.  4. Essential hypertension. Continuing home medications.  5. GERD. Continuing PPI.  DVT Prophylaxis: subcutaneous Heparin Nutrition: Cardiac and diabetic diet Advance goals of care discussion: Full code  Brief Summary of Hospitalization:  HPI: As per the H&P, "Sean Lin is a 80 y.o. male with a history of COPD, CAD, HTN, NIDDM, Hyperlipidemia who presents to the ED after  being seen by his pulmonologist during the day and referred to the ED. He had been having URI Symptoms of fevers, chills, nasal congestion sore throat and productive cough x 2 days. He and his wife report that their granddaughter was sick with cold symptoms this week. Patient denies SOB, but was found to have wheezing and hypoxia to 88% in the doctor's office and was placed on 2 liters NCO2. He was sent to the ED and was evaluated and had a chest x-ray which was negative for acute findings. He was administered Prednisone, and Duonebs, and referred for medical admission" Daily update: 10/09/2015 continuing IV antibiotics and IV steroids Consultants: None Procedures: None Antibiotics: Anti-infectives    Start     Dose/Rate Route Frequency Ordered Stop   10/09/15 1800  cefTRIAXone (ROCEPHIN) 1 g in dextrose 5 % 50 mL IVPB  Status:  Discontinued     1 g 100 mL/hr over 30 Minutes Intravenous Every 24 hours 10/08/15 2217 10/08/15 2246   10/09/15 1800  azithromycin (ZITHROMAX) 500 mg in dextrose 5 % 250 mL IVPB     500 mg 250 mL/hr over 60 Minutes Intravenous Every 24 hours 10/08/15 2217 10/16/15 1759   10/09/15 0600  cefTRIAXone (ROCEPHIN) 2 g in dextrose 5 % 50 mL IVPB     2 g 100 mL/hr over 30 Minutes Intravenous Every 24 hours 10/08/15 2246 10/16/15 0559   10/08/15 1745  cefTRIAXone (ROCEPHIN) 1 g in dextrose 5 % 50 mL IVPB     1 g 100 mL/hr over 30 Minutes Intravenous  Once 10/08/15 1737 10/08/15 1950   10/08/15 1745  azithromycin (ZITHROMAX) 500 mg in dextrose 5 % 250 mL IVPB     500 mg 250 mL/hr  over 60 Minutes Intravenous  Once 10/08/15 1737 10/08/15 2105      Family Communication: family was present at bedside, opportunity was given to ask question and all questions were answered satisfactorily at the time of interview.  Disposition:  Expected discharge date: 10/10/2015 Barriers to safe discharge: Oxygenation   Intake/Output Summary (Last 24 hours) at 10/09/15 1250 Last data  filed at 10/09/15 1045  Gross per 24 hour  Intake    760 ml  Output    500 ml  Net    260 ml   Filed Weights   10/08/15 2120  Weight: 88 kg (194 lb 0.1 oz)    Objective: Physical Exam: Filed Vitals:   10/09/15 0420 10/09/15 0605 10/09/15 0906 10/09/15 1126  BP:  124/52  147/61  Pulse:  94  93  Temp:  97.5 F (36.4 C)  98.6 F (37 C)  TempSrc:  Oral  Oral  Resp:  20  19  Height:      Weight:      SpO2: 96% 98% 92% 95%     General: Appear in marked distress, no Rash; Oral Mucosa moist. Cardiovascular: S1 and S2 Present, no Murmur, no JVD Respiratory: Bilateral Air entry present and no Crackles, bilateral expiratory wheezes Abdomen: Bowel Sound present, Soft and no tenderness Extremities: no Pedal edema, no calf tenderness Neurology: Grossly no focal neuro deficit.  Data Reviewed: CBC:  Recent Labs Lab 10/08/15 1803 10/09/15 0514  WBC 17.1* 12.6*  NEUTROABS 13.5*  --   HGB 13.8 12.4*  HCT 41.7 38.4*  MCV 91.0 91.2  PLT 245 [id number]   Basic Metabolic Panel:  Recent Labs Lab 10/08/15 1803 10/09/15 0514  NA 138 135  K 4.0 4.0  CL 99* 101  CO2 28 25  GLUCOSE 159* 226*  BUN 21* 21*  CREATININE 1.38* 1.19  CALCIUM 9.1 8.1*   Liver Function Tests:  Recent Labs Lab 10/08/15 1803  AST 23  ALT 19  ALKPHOS 80  BILITOT 1.3*  PROT 7.8  ALBUMIN 4.1   No results for input(s): LIPASE, AMYLASE in the last 168 hours. No results for input(s): AMMONIA in the last 168 hours.  Cardiac Enzymes: No results for input(s): CKTOTAL, CKMB, CKMBINDEX, TROPONINI in the last 168 hours. BNP (last 3 results) No results for input(s): BNP in the last 8760 hours.  ProBNP (last 3 results) No results for input(s): PROBNP in the last 8760 hours.   CBG:  Recent Labs Lab 10/09/15 0841 10/09/15 1232  GLUCAP 256* 255*    Recent Results (from the past 240 hour(s))  Blood Culture (routine x 2)     Status: None (Preliminary result)   Collection Time: 10/08/15  5:25 PM    Result Value Ref Range Status   Specimen Description   Final    BLOOD LEFT FOREARM Performed at [facility]    Special Requests BOTTLES DRAWN AEROBIC AND ANAEROBIC [facility]  Final   Culture PENDING  Incomplete   Report Status PENDING  Incomplete  Urine culture     Status: None (Preliminary result)   Collection Time: 10/08/15  7:16 PM  Result Value Ref Range Status   Specimen Description URINE, CLEAN CATCH  Final   Special Requests NONE  Final   Culture   Final    TOO YOUNG TO READ Performed at [facility]    Report Status PENDING  Incomplete     Studies: Dg Chest 2 View  10/08/2015  CLINICAL DATA:  Hypoxia  and fever. EXAM: CHEST  2 VIEW COMPARISON:  12/20/2014 FINDINGS: The heart size and mediastinal contours are within normal limits. Chronic asymmetric elevation of right hemidiaphragm is again noted. No pleural effusion or edema. No airspace consolidation. Both lungs are clear. The visualized skeletal structures are unremarkable. IMPRESSION: 1. No acute cardiopulmonary abnormalities. 2. Chronic asymmetric elevation of right hemidiaphragm. Electronically Signed   By: Kerby Moors M.D.   On: 10/08/2015 18:02     Scheduled Meds: . aspirin EC  325 mg Oral Daily  . atorvastatin  40 mg Oral q1800  . azithromycin  500 mg Intravenous Q24H  . cefTRIAXone (ROCEPHIN)  IV  2 g Intravenous Q24H  . cholecalciferol  2,000 Units Oral Daily  . enoxaparin (LOVENOX) injection  40 mg Subcutaneous QHS  . felodipine  10 mg Oral Daily  . guaiFENesin  600 mg Oral BID  . insulin aspart  0-15 Units Subcutaneous TID WC  . insulin aspart  0-5 Units Subcutaneous QHS  . ipratropium-albuterol  3 mL Nebulization Q4H  . losartan  100 mg Oral Daily  . methylPREDNISolone (SOLU-MEDROL) injection  60 mg Intravenous Q12H  . pantoprazole  40 mg Oral Daily   Continuous Infusions:   Time spent: 35 minutes  Author: Berle Mull, MD [facility] Pager: [phone] 10/09/2015 12:50  PM  If 7PM-7AM, please contact night-coverage at [email or web address], password [facility]

## 2015-10-09 NOTE — Progress Notes (Signed)
PHARMACY NOTE -  Dr. Arnoldo Morale  Pharmacy has been assisting with dosing of Azithromycin/ceftriaxone for CAP. Dosage remains stable at current dosing and need for further dosage adjustment appears unlikely at present.    Will sign off at this time.  Please reconsult if a change in clinical status warrants re-evaluation of dosage.

## 2015-10-09 NOTE — Care Management Note (Signed)
Case Management Note  Patient Details  Name: Sean Lin MRN: [id number] Date of Birth: 1935-04-16  Subjective/Objective:           Sepsis versus pna         Action/Plan:Date: October 09, 2015 Chart reviewed for concurrent status and case management needs. Will continue to follow patient for changes and needs: Velva Harman, RN, BSN, [place]   [phone]   Expected Discharge Date:                  Expected Discharge Plan:  Home/Self Care  In-House Referral:  NA  Discharge planning Services  CM Consult  Post Acute Care Choice:  NA Choice offered to:  NA  DME Arranged:  N/A DME Agency:  NA  HH Arranged:  NA HH Agency:  NA  Status of Service:  In process, will continue to follow  Medicare Important Message Given:    Date Medicare IM Given:    Medicare IM give by:    Date Additional Medicare IM Given:    Additional Medicare Important Message give by:     If discussed at [place] of Stay Meetings, dates discussed:    Additional Comments:  Leeroy Cha, RN 10/09/2015, 8:30 AM

## 2015-10-10 LAB — CBC
HCT: 36.3 % — ABNORMAL LOW (ref 39.0–52.0)
Hemoglobin: 12.3 g/dL — ABNORMAL LOW (ref 13.0–17.0)
MCH: 30.1 pg (ref 26.0–34.0)
MCHC: 33.9 g/dL (ref 30.0–36.0)
MCV: 89 fL (ref 78.0–100.0)
PLATELETS: 252 10*3/uL (ref 150–400)
RBC: 4.08 MIL/uL — AB (ref 4.22–5.81)
RDW: 12.2 % (ref 11.5–15.5)
WBC: 14.6 10*3/uL — AB (ref 4.0–10.5)

## 2015-10-10 LAB — BASIC METABOLIC PANEL
ANION GAP: 10 (ref 5–15)
BUN: 34 mg/dL — ABNORMAL HIGH (ref 6–20)
CO2: 25 mmol/L (ref 22–32)
Calcium: 8.5 mg/dL — ABNORMAL LOW (ref 8.9–10.3)
Chloride: 102 mmol/L (ref 101–111)
Creatinine, Ser: 1.21 mg/dL (ref 0.61–1.24)
GFR calc Af Amer: >60 mL/min (ref >60)
GFR, EST NON AFRICAN AMERICAN: 56 mL/min — AB (ref >60)
Glucose, Bld: 332 mg/dL — ABNORMAL HIGH (ref 65–99)
POTASSIUM: 3.6 mmol/L (ref 3.5–5.1)
SODIUM: 137 mmol/L (ref 135–145)

## 2015-10-10 LAB — GLUCOSE, CAPILLARY: GLUCOSE-CAPILLARY: 308 mg/dL — AB (ref 65–99)

## 2015-10-10 MED ORDER — ALBUTEROL SULFATE HFA 108 (90 BASE) MCG/ACT IN AERS: 2.0000 | INHALATION_SPRAY | Freq: Four times a day (QID) | RESPIRATORY_TRACT | Status: DC | PRN

## 2015-10-10 MED ORDER — AZITHROMYCIN 500 MG PO TABS: 500.0000 mg | ORAL_TABLET | Freq: Every day | ORAL | Status: AC

## 2015-10-10 MED ORDER — PREDNISONE 50 MG PO TABS
50.0000 mg | ORAL_TABLET | Freq: Every day | ORAL | Status: DC
  Administered 2015-10-10: 50 mg via ORAL
  Filled 2015-10-10 (×2): qty 1

## 2015-10-10 MED ORDER — GUAIFENESIN ER 600 MG PO TB12: 600.0000 mg | ORAL_TABLET | Freq: Two times a day (BID) | ORAL | Status: DC

## 2015-10-10 MED ORDER — GLIMEPIRIDE 1 MG PO TABS: 1.0000 mg | ORAL_TABLET | Freq: Every day | ORAL | Status: DC | PRN

## 2015-10-10 MED ORDER — CEFUROXIME AXETIL 500 MG PO TABS: 500.0000 mg | ORAL_TABLET | Freq: Two times a day (BID) | ORAL | Status: AC

## 2015-10-10 MED ORDER — PREDNISONE 10 MG PO TABS: ORAL_TABLET | ORAL | Status: DC

## 2015-10-10 NOTE — Progress Notes (Signed)
Pt VSS. O2 sats > 90% RA while ambulating. D/C summary reviewed at bedside w/ Pt wife. Reviewed Medication administration at home. No further questions at this time. IV removed.

## 2015-10-10 NOTE — Clinical Documentation Improvement (Signed)
Hospitalist  Abnormal Lab/Test Results:  Blood sugars - 256, 255, 320, 318, 308  Possible Clinical Conditions associated with below indicators  Diabetes uncontrolled  Other Condition  Cannot Clinically Determine   Supporting Information: Pt has diabetes 2  Treatment Provided: Changed oral hypoglycemic agent to sliding scale insulin   Please exercise your independent, professional judgment when responding. A specific answer is not anticipated or expected.   Thank You,  [place] [phone]

## 2015-10-11 LAB — HEMOGLOBIN A1C
Hgb A1c MFr Bld: 7.6 % — ABNORMAL HIGH (ref 4.8–5.6)
Mean Plasma Glucose: 171 mg/dL

## 2015-10-12 LAB — URINE CULTURE: Culture: 40000

## 2015-10-13 LAB — CULTURE, BLOOD (ROUTINE X 2)
CULTURE: NO GROWTH
Culture: NO GROWTH

## 2015-10-13 NOTE — Discharge Summary (Signed)
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   Addendum: Patient's hemoglobin A1c was 7.6 and patient was recommended to follow-up with PCP. Urine culture grew enterococcus although the patient was not symptomatic and therefore no need for antibiotics. Patient did not receive any medication electronically at his pharmacy and therefore medications were reordered to the pharmacy requested by the patient, his antibiotics were changed to Levaquin completed a 4 day treatment.  Sean Lin 4:21 PM 10/13/2015     Recommendations for Outpatient Follow-up:  1. Follow-up with PCP in one week 2. Follow-up with pulmonology as needed   Diet recommendation: Cardiac and diabetic diet  Activity: The patient is advised to gradually reintroduce usual activities.   Discharge Condition: good  History of present illness: As per the H and P dictated on admission, "Sean Lin is a 80 y.o. male with a history of COPD, CAD, HTN, NIDDM, Hyperlipidemia who presents to the ED after being seen by his pulmonologist during the day and referred to the ED. He had been having URI Symptoms of fevers, chills, nasal congestion sore throat and productive cough x 2 days. He and his wife report that their granddaughter was sick with cold symptoms this week. Patient denies SOB, but was found to have wheezing and hypoxia to 88% in the doctor's office and was placed on 2 liters NCO2. He was sent to the ED and was evaluated and had a  chest x-ray which was negative for acute findings. He was administered Prednisone, and Duonebs, and referred for medical admission."  Hospital Course:  Summary of his active problems in the hospital is as following. 1. Acute respiratory failure with hypoxia (HCC)  Acute bronchitis with sepsis Acute exacerbation of chronic asthma without status asthmaticus  Patient presented with complaints of worsening cough shortness of breath as well as fever. With fever, tachycardia, tachypnea, hypoxia, leukocytosis patient meeting criteria for sepsis. Cultures remain negative, patient is improved significantly the next day and was remaining on room air and ambulating on room air as well. Did not have any further wheezing. The patient will be discharged home on prednisone taper, continuing with home inhalers as well as Ceftin and azithromycin.  2. Type 2 diabetes mellitus. Patient will be given one week worth of extra Amaryl to work with steroid-induced hyperglycemia. Patient requested to return back to regular regimen should his glucose remained less than 200.  3. Dyslipidemia. Continue home medication.  4. Essential hypertension. Continuing home medications.  5. GERD. Continue PPI  All other chronic medical condition were stable during the hospitalization.  Patient was ambulatory without any assistance.  Procedures and Results:  None   Consultations:  None  Discharge Exam: Filed Weights   10/08/15 2120  Weight: 88 kg (194 lb 0.1 oz)   Filed Vitals:   10/10/15 0527 10/10/15 0930  BP: 114/60 133/65  Pulse: 107 108  Temp: 98 F (36.7 C) 98.2 F (36.8 C)  Resp: 20 20    General: Appear in no distress, no Rash; Oral  Mucosa moist. Cardiovascular: S1 and S2 Present, no Murmur, no JVD Respiratory: Bilateral Air entry present and Clear to Auscultation, no Crackles, nowheezes Abdomen: Bowel Sound present, Soft and no tenderness Extremities: no Pedal edema, no calf  tenderness Neurology: Grossly no focal neuro deficit.  DISCHARGE MEDICATION: Discharge Instructions    Call MD for:  difficulty breathing, headache or visual disturbances    Complete by:  As directed      Call MD for:  extreme fatigue    Complete by:  As directed      Call MD for:  persistant dizziness or light-headedness    Complete by:  As directed      Call MD for:  temperature >100.4    Complete by:  As directed      Diet - low sodium heart healthy    Complete by:  As directed      Increase activity slowly    Complete by:  As directed           Discharge Medication List as of 10/10/2015 10:24 AM    START taking these medications   Details  !! albuterol (PROVENTIL HFA;VENTOLIN HFA) 108 (90 BASE) MCG/ACT inhaler Inhale 2 puffs into the lungs every 6 (six) hours as needed for wheezing or shortness of breath., Starting 10/10/2015, Until Discontinued, Normal    azithromycin (ZITHROMAX) 500 MG tablet Take 1 tablet (500 mg total) by mouth daily., Starting 10/10/2015, Until Wed 10/15/15, Normal    cefUROXime (CEFTIN) 500 MG tablet Take 1 tablet (500 mg total) by mouth 2 (two) times daily with a meal., Starting 10/10/2015, Until Wed 10/15/15, Normal    !! glimepiride (AMARYL) 1 MG tablet Take 1 tablet (1 mg total) by mouth daily as needed (with breakfast for blood sugar more than 200 fasting while on prednisone)., Starting 10/10/2015, Until Discontinued, Normal    guaiFENesin (MUCINEX) 600 MG 12 hr tablet Take 1 tablet (600 mg total) by mouth 2 (two) times daily., Starting 10/10/2015, Until Discontinued, Normal    predniSONE (DELTASONE) 10 MG tablet Take 40 mg daily for 3 days,Take 30 mg daily for 3 days,Take 20 mg daily for 3 days,Take 10 mg daily for 3 days, then stop., Normal     !! - Potential duplicate medications found. Please discuss with provider.    CONTINUE these medications which have NOT CHANGED   Details  !! albuterol (VENTOLIN HFA) 108 (90 BASE) MCG/ACT inhaler Inhale 2  puffs into the lungs 2 (two) times daily.  , Until Discontinued, Historical Med    aspirin EC 325 MG tablet Take 325 mg by mouth daily.  , Until Discontinued, Historical Med    atorvastatin (LIPITOR) 40 MG tablet Take 40 mg by mouth daily., Until Discontinued, Historical Med    Cholecalciferol (VITAMIN D) 2000 UNITS CAPS Take 1 capsule by mouth daily., Until Discontinued, Historical Med    dextromethorphan-guaiFENesin (MUCINEX DM) 30-600 MG 12hr tablet Take 1 tablet by mouth daily as needed for cough., Until Discontinued, Historical Med    felodipine (PLENDIL) 10 MG 24 hr tablet Take 10 mg by mouth daily.  , Until Discontinued, Historical Med    furosemide (LASIX) 20 MG tablet TAKE ONE TABLET EVERY DAY, Normal    !! glimepiride (AMARYL) 1 MG tablet Take 1 tablet by mouth daily., Starting 09/19/2015, Until Discontinued, Historical Med    losartan-hydrochlorothiazide (HYZAAR) 100-12.5 MG per tablet Take 1 tablet by mouth daily.  , Until Discontinued, Historical Med    Multiple Vitamins-Minerals (MULTIVITAL) tablet Take  1 tablet by mouth daily.  , Until Discontinued, Historical Med    neomycin-bacitracin-polymyxin (NEOSPORIN) OINT Apply 1 application topically daily., Until Discontinued, Historical Med    omeprazole (PRILOSEC OTC) 20 MG tablet Take 20 mg by mouth daily.  , Until Discontinued, Historical Med    SYMBICORT 160-4.5 MCG/ACT inhaler INHALE 2 PUFFS BY MOUTH 2 TIMES DAILY, Normal    traMADol (ULTRAM) 50 MG tablet Take 100 mg by mouth 3 (three) times daily as needed. , Until Discontinued, Historical Med    triamcinolone (NASACORT ALLERGY 24HR) 55 MCG/ACT AERO nasal inhaler Place 2 sprays into the nose daily as needed (allergies)., Until Discontinued, Historical Med    Respiratory Therapy Supplies (FLUTTER) DEVI Use as directed., Print     !! - Potential duplicate medications found. Please discuss with provider.     Allergies  Allergen Reactions  . Codeine     nausea  .  Hydrocodone     REACTION: nausea  . Levofloxacin     REACTION: nausea   Follow-up Information    Follow up with Sean Rile, MD On 10/15/2015.   Specialty:  Internal Medicine   Why:  Please follow up with Dr. Bea Graff on Wednesday, December 14th at 2:50pm   Contact information:   [place]  [phone]       Follow up with [facility]. Call in 1 week.   Specialty:  Pulmonology   Contact information:   [place] [phone]      The results of significant diagnostics from this hospitalization (including imaging, microbiology, ancillary and laboratory) are listed below for reference.    Significant Diagnostic Studies: Dg Chest 2 View  10/08/2015  CLINICAL DATA:  Hypoxia and fever. EXAM: CHEST  2 VIEW COMPARISON:  12/20/2014 FINDINGS: The heart size and mediastinal contours are within normal limits. Chronic asymmetric elevation of right hemidiaphragm is again noted. No pleural effusion or edema. No airspace consolidation. Both lungs are clear. The visualized skeletal structures are unremarkable. IMPRESSION: 1. No acute cardiopulmonary abnormalities. 2. Chronic asymmetric elevation of right hemidiaphragm. Electronically Signed   By: Kerby Moors M.D.   On: 10/08/2015 18:02    Microbiology: Recent Results (from the past 240 hour(s))  Blood Culture (routine x 2)     Status: None (Preliminary result)   Collection Time: 10/08/15  5:25 PM  Result Value Ref Range Status   Specimen Description BLOOD LEFT FOREARM  Final   Special Requests BOTTLES DRAWN AEROBIC AND ANAEROBIC 5CC EACH  Final   Culture   Final    NO GROWTH 4 DAYS Performed at [facility]    Report Status PENDING  Incomplete  Blood Culture (routine x 2)     Status: None (Preliminary result)   Collection Time: 10/08/15  6:32 PM  Result Value Ref Range Status   Specimen Description BLOOD RIGHT ANTECUBITAL  Final   Special Requests  BOTTLES DRAWN AEROBIC AND ANAEROBIC 10CC EACH  Final   Culture   Final    NO GROWTH 4 DAYS Performed at [facility]    Report Status PENDING  Incomplete  Urine culture     Status: None   Collection Time: 10/08/15  7:16 PM  Result Value Ref Range Status   Specimen Description URINE, CLEAN CATCH  Final   Special Requests NONE  Final   Culture   Final    40,000 COLONIES/ml ENTEROCOCCUS SPECIES Performed at [facility]  Report Status 10/12/2015 FINAL  Final   Organism ID, Bacteria ENTEROCOCCUS SPECIES  Final      Susceptibility   Enterococcus species - MIC*    AMPICILLIN <=2 SENSITIVE Sensitive     LEVOFLOXACIN 1 SENSITIVE Sensitive     NITROFURANTOIN <=16 SENSITIVE Sensitive     VANCOMYCIN 1 SENSITIVE Sensitive     * 40,000 COLONIES/ml ENTEROCOCCUS SPECIES     Labs: CBC:  Recent Labs Lab 10/08/15 1803 10/09/15 0514 10/10/15 0515  WBC 17.1* 12.6* 14.6*  NEUTROABS 13.5*  --   --   HGB 13.8 12.4* 12.3*  HCT 41.7 38.4* 36.3*  MCV 91.0 91.2 89.0  PLT 245 212 [id number]   Basic Metabolic Panel:  Recent Labs Lab 10/08/15 1803 10/09/15 0514 10/10/15 0515  NA 138 135 137  K 4.0 4.0 3.6  CL 99* 101 102  CO2 28 25 25   GLUCOSE 159* 226* 332*  BUN 21* 21* 34*  CREATININE 1.38* 1.19 1.21  CALCIUM 9.1 8.1* 8.5*   Liver Function Tests:  Recent Labs Lab 10/08/15 1803  AST 23  ALT 19  ALKPHOS 80  BILITOT 1.3*  PROT 7.8  ALBUMIN 4.1   No results for input(s): LIPASE, AMYLASE in the last 168 hours. No results for input(s): AMMONIA in the last 168 hours.  Cardiac Enzymes: No results for input(s): CKTOTAL, CKMB, CKMBINDEX, TROPONINI in the last 168 hours. BNP (last 3 results) No results for input(s): BNP in the last 8760 hours.  ProBNP (last 3 results) No results for input(s): PROBNP in the last 8760 hours.  CBG:  Recent Labs Lab 10/09/15 0841 10/09/15 1232 10/09/15 1655 10/09/15 2057 10/10/15 0730  GLUCAP 256* 255* 320* 318* 308*    Time  spent: 30 minutes  Signed:  Mardell Cragg  [facility] 10/13/2015, 4:17 PM

## 2015-10-15 ENCOUNTER — Telehealth: Payer: Self-pay | Admitting: Internal Medicine

## 2015-10-15 NOTE — Telephone Encounter (Signed)
Called and spoke with pt's wife Wife stated that pt was discharged last week from hospital and had instructions to call office this week to get an appt for hospital f/u appt for next week Pt schedule for 10/23/15 at 2:45pm with TP for appt Estill Bamberg approved this spot being used  Nothing further is needed

## 2015-10-23 ENCOUNTER — Encounter: Payer: Self-pay | Admitting: Adult Health

## 2015-10-23 ENCOUNTER — Ambulatory Visit (INDEPENDENT_AMBULATORY_CARE_PROVIDER_SITE_OTHER): Payer: Medicare HMO | Admitting: Adult Health

## 2015-10-23 VITALS — BP 112/68 | HR 66 | Temp 98.4°F | Ht 66.0 in | Wt 192.0 lb

## 2015-10-23 DIAGNOSIS — J4541 Moderate persistent asthma with (acute) exacerbation: Secondary | ICD-10-CM | POA: Diagnosis not present

## 2015-10-23 NOTE — Assessment & Plan Note (Signed)
Recent exacerbation with bronchitis, now resolved  Plan  Continue on current regimen .  Follow up Dr. Lamonte Sakai  In 2-3 months and As needed

## 2015-10-23 NOTE — Patient Instructions (Signed)
Continue on current regimen .  Follow up Dr. Lamonte Sakai  In 2-3 months and As needed

## 2015-10-23 NOTE — Progress Notes (Signed)
  Subjective:    Patient ID: Sean Lin, male    DOB: 1935/09/09, 80 y.o.   MRN: [id number] HPI 80 yo man, hx former tobacco (8 pk-yrs), CAD/CABG '91, HTN, DM, prostate CA. And Asthma   PULMONARY FUNCTON TEST 10/11/2011  FVC 3.21  FEV1 1.28  FEV1/FVC 39.9  FVC  % Predicted 54  FEV % Predicted 47  FeF 25-75 0.54  FeF 25-75 % Predicted 2.43   10/23/2015  post hospital follow-up :Severe Asthma /R HD paralysis  Patient presents for a post hospital follow-up He was admitted with acute asthmatic bronchitis, exacerbation with acute hypoxic respiratory failure.  he was treated with IV antibiotics, nebulizers and steroids. Patient was discharged on antibiotics. Ceftin and azithromycin along with a prednisone taper.  He returns today feeling much improved with decreased cough, shortness of breath. Feels that his breathing has returned back to baseline. He denies any chest pain, orthopnea, PND, hemoptysis, or nausea, vomiting, diarrhea.     ROS  Constitutional:   No  weight loss, night sweats,  Fevers, chills,  +fatigue, or  lassitude.  HEENT:   No headaches,  Difficulty swallowing,  Tooth/dental problems, or  Sore throat,                No sneezing, itching, ear ache, nasal congestion, post nasal drip,   CV:  No chest pain,  Orthopnea, PND, swelling in lower extremities, anasarca, dizziness, palpitations, syncope.   GI  No heartburn, indigestion, abdominal pain, nausea, vomiting, diarrhea, change in bowel habits, loss of appetite, bloody stools.   Resp:    No chest wall deformity  Skin: no rash or lesions.  GU: no dysuria, change in color of urine, no urgency or frequency.  No flank pain, no hematuria   MS:  No joint pain or swelling.  No decreased range of motion.  No back pain.  Psych:  No change in mood or affect. No depression or anxiety.  No memory loss.       Objective:   Physical Exam  Filed Vitals:   10/23/15 1449  BP: 112/68  Pulse: 66  Temp: 98.4 F (36.9 C)   TempSrc: Oral  Height: 5\' 6"  (1.676 m)  Weight: 192 lb (87.091 kg)  SpO2: 94%     Gen: Pleasant, over wt man, in no distress,  normal affect  ENT: No lesions,  mouth clear,  oropharynx clear   Neck: No JVD, no TMG, no carotid bruits  Lungs: No use of accessory muscles, distant bs   Cardiovascular: RRR, heart sounds normal, no murmur or gallops, no peripheral edema  Musculoskeletal: No deformities, no cyanosis or clubbing  Neuro: alert, non focal  Skin: Warm, no lesions or rashes   Sniff Test 02/21/12:  IMPRESSION:  Elevation of the right hemidiaphragm with associated diaphragmatic  Paralysis   10/08/15 chronic changes , nad      Assessment & Plan:     No problem-specific assessment & plan notes found for this encounter.

## 2015-12-26 ENCOUNTER — Ambulatory Visit: Payer: Medicare HMO | Admitting: Emergency Medicine

## 2015-12-31 ENCOUNTER — Ambulatory Visit: Payer: Medicare HMO | Admitting: Acute Care

## 2016-01-09 ENCOUNTER — Encounter: Payer: Self-pay | Admitting: Acute Care

## 2016-01-09 ENCOUNTER — Ambulatory Visit (INDEPENDENT_AMBULATORY_CARE_PROVIDER_SITE_OTHER): Payer: Medicare HMO | Admitting: Acute Care

## 2016-01-09 VITALS — BP 126/66 | HR 71 | Ht 66.0 in | Wt 199.8 lb

## 2016-01-09 DIAGNOSIS — J4541 Moderate persistent asthma with (acute) exacerbation: Secondary | ICD-10-CM

## 2016-01-09 NOTE — Progress Notes (Addendum)
Subjective:    Patient ID: Sean Lin, male    DOB: 03/30/1935, 81 y.o.   MRN: [id number]  HPI  81 yo man, hx former tobacco (8 pk-yrs), COPD,CAD/CABG '91, HTN, DM, prostate CA. And Asthma seen by Dr. Lamonte Sakai.  PULMONARY FUNCTON TEST 10/11/2011  FVC 3.21  FEV1 1.28  FEV1/FVC 39.9  FVC % Predicted 54  FEV % Predicted 47  FeF 25-75 0.54  FeF 25-75 % Predicted 2.43         Significant Events: Hospital Admission: Date of admission: 12/7/2016Date of discharge: 10/10/2015  Principal Problem:  Acute respiratory failure with hypoxia ([place])   Acute Bronchitis  01/09/16: Follow up 2-3 month office visit : Patient presents to the office for follow up.Patient continues to feel well. Denies cough or shortness of breath, denies wheezing. He denies any chest pain, orthopnea, PND, hemoptysis, or nausea, vomiting, diarrhea.Breathing remains at patient's baseline.We did discuss that patient's wife has noticed that after injection he gets for his knee ( they do not know what it is) he does have increased cough. We discussed that they should consider not repeating the injection if it does cause a worsening of his asthma symptoms/ COPD. They verbalized understanding. There are no further injections planned.  Current outpatient prescriptions:  .  albuterol (PROVENTIL HFA;VENTOLIN HFA) 108 (90 BASE) MCG/ACT inhaler, Inhale 2 puffs into the lungs every 6 (six) hours as needed for wheezing or shortness of breath., Disp: 1 Inhaler, Rfl: 0 .  albuterol (VENTOLIN HFA) 108 (90 BASE) MCG/ACT inhaler, Inhale 2 puffs into the lungs 2 (two) times daily.  , Disp: , Rfl:  .  aspirin EC 325 MG tablet, Take 325 mg by mouth daily.  , Disp: , Rfl:  .  atorvastatin (LIPITOR) 40 MG tablet, Take 40 mg by mouth daily., Disp: , Rfl:  .  felodipine (PLENDIL) 10 MG 24 hr tablet, Take 10 mg by mouth daily. Reported on 10/23/2015, Disp: , Rfl:  .  furosemide (LASIX) 20 MG tablet,  TAKE ONE TABLET EVERY DAY, Disp: 30 tablet, Rfl: 6 .  glimepiride (AMARYL) 1 MG tablet, Take 1 tablet by mouth daily., Disp: , Rfl: 5 .  glimepiride (AMARYL) 1 MG tablet, Take 1 tablet (1 mg total) by mouth daily as needed (with breakfast for blood sugar more than 200 fasting while on prednisone)., Disp: 7 tablet, Rfl: 0 .  losartan-hydrochlorothiazide (HYZAAR) 100-12.5 MG per tablet, Take 1 tablet by mouth daily.  , Disp: , Rfl:  .  neomycin-bacitracin-polymyxin (NEOSPORIN) OINT, Apply 1 application topically daily., Disp: , Rfl:  .  omeprazole (PRILOSEC OTC) 20 MG tablet, Take 20 mg by mouth daily.  , Disp: , Rfl:  .  Respiratory Therapy Supplies (FLUTTER) DEVI, Use as directed., Disp: 1 each, Rfl: 0 .  SYMBICORT 160-4.5 MCG/ACT inhaler, INHALE 2 PUFFS BY MOUTH 2 TIMES DAILY, Disp: 10.2 g, Rfl: 5 .  traMADol (ULTRAM) 50 MG tablet, Take 100 mg by mouth 3 (three) times daily as needed. , Disp: , Rfl:    Past Medical History  Diagnosis Date  . CAD (coronary artery disease)     A. 03/21/97: s/p CABG x4 (pvt) - [place] - LAD; SVG -DIAG; SVG-OM; SVG-RCA B. 10/04/2008: EX. MV - EX. TIME 5:15; MAX HR 155; EF 65%; LOW RISK STUDY WITH NL     . Depression   . HTN (hypertension)   . HLD (hyperlipidemia)   . Cerebrovascular disease   . Prostate cancer ([place])  hx  . [place] hemorrhagic fever     pending r tha ??    Allergies  Allergen Reactions  . Codeine     nausea  . Hydrocodone     REACTION: nausea  . Levofloxacin     REACTION: nausea    Review of Systems Constitutional:   No  weight loss, night sweats,  Fevers, chills, fatigue, or  lassitude.  HEENT:   No headaches,  Difficulty swallowing,  Tooth/dental problems, or  Sore throat,                No sneezing, itching, ear ache, nasal congestion, post nasal drip,   CV:  No chest pain,  Orthopnea, PND, swelling in lower extremities, anasarca, dizziness, palpitations, syncope.   GI  No heartburn, indigestion, abdominal pain, nausea, vomiting,  diarrhea, change in bowel habits, loss of appetite, bloody stools.   Resp: No shortness of breath with exertion or at rest.  No excess mucus, no productive cough,  No non-productive cough,  No coughing up of blood.  No change in color of mucus.  No wheezing.  No chest wall deformity  Skin: no rash or lesions.  GU: no dysuria, change in color of urine, no urgency or frequency.  No flank pain, no hematuria   MS:  No joint pain or swelling.  No decreased range of motion.  No back pain.  Psych:  No change in mood or affect. No depression or anxiety.  No memory loss.        Objective:   Physical Exam  Physical Exam:  General- No distress,  A&Ox3, pleasant ENT: No sinus tenderness, TM clear, pale nasal mucosa, no oral exudate,no post nasal drip, no LAN Cardiac: S1, S2, regular rate and rhythm, no murmur Chest: No wheeze/ rales/ dullness; no accessory muscle use, no nasal flaring, no sternal retractions Abd.: Soft Non-tender Ext: No clubbing cyanosis, edema Neuro:  normal strength Skin: No rashes, warm and dry, abrasion to right cheek from recent fall Psych: normal mood and behavior   Magdalen Spatz, AGACNP-BC [place] Pager # [phone] 01/09/2016    Assessment & Plan:

## 2016-01-09 NOTE — Patient Instructions (Addendum)
It is nice to meet you today. You look great today. Continue your Symbicort 2 puffs twice daily. Continue your Ventolin 2 puffs twice daily. Follow up with Dr. Lamonte Sakai in 6 months. Call us sooner if needed, so we can be proactive if you are getting sick. Please contact office for sooner follow up if symptoms do not improve or worsen or seek emergency care

## 2016-01-09 NOTE — Assessment & Plan Note (Signed)
Three month Follow up for resolved bronchitis exacerbation requiring  Hospitalization in Dec. 2016. No current respiratory problems. Plan: Continue your Symbicort 2 puffs twice daily. Continue your Ventolin 2 puffs twice daily. Follow up with Dr. Lamonte Sakai in 6 months. Call us sooner if needed, so we can be proactive if you are getting sick. Please contact office for sooner follow up if symptoms do not improve or worsen or seek emergency care

## 2016-02-03 ENCOUNTER — Other Ambulatory Visit: Payer: Self-pay | Admitting: Emergency Medicine

## 2016-03-12 ENCOUNTER — Telehealth: Payer: Self-pay | Admitting: Emergency Medicine

## 2016-03-12 NOTE — Telephone Encounter (Signed)
Symbicort rx sent in April to [facility]. Spoke with [facility] - was advised they do have this rx and medication is ready for pick up.  Pt aware, was very appreciative, and voiced no further questions or concerns at this time.

## 2016-07-07 ENCOUNTER — Encounter: Payer: Self-pay | Admitting: Emergency Medicine

## 2016-07-07 ENCOUNTER — Ambulatory Visit (INDEPENDENT_AMBULATORY_CARE_PROVIDER_SITE_OTHER): Payer: Medicare HMO | Admitting: Emergency Medicine

## 2016-07-07 DIAGNOSIS — Z23 Encounter for immunization: Secondary | ICD-10-CM | POA: Diagnosis not present

## 2016-07-07 DIAGNOSIS — J4541 Moderate persistent asthma with (acute) exacerbation: Secondary | ICD-10-CM

## 2016-07-07 MED ORDER — ALBUTEROL SULFATE (2.5 MG/3ML) 0.083% IN NEBU: 2.5000 mg | INHALATION_SOLUTION | RESPIRATORY_TRACT | 5 refills | Status: DC | PRN

## 2016-07-07 NOTE — Progress Notes (Signed)
  Subjective:    Patient ID: Sean Lin, male    DOB: 25-Dec-1934, 81 y.o.   MRN: [id number] HPI ROV 07/07/16 -- this follow-up visit for 81 year old gentleman with history of asthma (minimal tobacco history). Also with a history of right hemidiaphragm paralysis. I haven't seen him since October 2015 but he reestablished care here in March 2017. Most recent Pulmonary  function testing was from 2012 which showed severe obstruction, FEV1 1.28 L (47% predicted) with a ratio of 40%. He was hospitalized for an AE in January. He has been doing well since. He is on Symbicort bid, uses ventolin in conjunction with this. Has not had flu shot yet.     Objective:   Physical Exam  Vitals:   07/07/16 0942 07/07/16 0943  BP:  (!) 142/72  BP Location:  Left Arm  Cuff Size:  Normal  Pulse:  81  SpO2:  93%  Weight: 194 lb (88 kg)   Height: 5\' 9"  (1.753 m)      Gen: Pleasant, over wt man, in no distress,  normal affect  ENT: No lesions,  mouth clear,  oropharynx clear, no postnasal drip, some hoarse voice  Neck: No JVD, no TMG, no carotid bruits  Lungs: No use of accessory muscles, distant, few rhonchi and tr exp wheezing , barking cough, no stridor   Cardiovascular: RRR, heart sounds normal, no murmur or gallops, no peripheral edema  Musculoskeletal: No deformities, no cyanosis or clubbing  Neuro: alert, non focal  Skin: Warm, no lesions or rashes   Sniff Test 02/21/12:  IMPRESSION:  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 No pathologically  enlarged mediastinal, hilar or axillary lymph nodes are present.  There is stable elevation of the right hemidiaphragm with associated  right basilar atelectasis. No significant pleural or pericardial  effusion is present. The thoracic inlet appears normal.  The multifocal  right lung opacities noted on the original study  remain resolved. There is a stable tiny left lower lobe nodule on  image 41. No new or enlarging pulmonary nodules are identified.  There is no confluent airspace opacity.  Images through the upper abdomen demonstrate stable mild hepatic  steatosis and a low-density 1.6 cm right renal lesion on image 59.  There are no worrisome osseous findings.      Assessment & Plan:     Asthma, extrinsic, without status asthmaticus He has been stable since an exacerbation in January 2017. Currently using Symbicort and albuterol twice a day. He will continue this regimen use albuterol when necessary as well. Flu shot today. Follow-up in 6 months   Baltazar Apo, MD, PhD 07/07/2016, 10:05 AM  [facility] [phone] or if no answer [phone]

## 2016-07-07 NOTE — Assessment & Plan Note (Signed)
He has been stable since an exacerbation in January 2017. Currently using Symbicort and albuterol twice a day. He will continue this regimen use albuterol when necessary as well. Flu shot today. Follow-up in 6 months

## 2016-07-07 NOTE — Patient Instructions (Addendum)
Please continue your Symbicort twice a day  Take albuterol 2 puffs up to every 4 hours if needed for shortness of breath.  We will give the flu shot today.  Follow with Dr Lamonte Sakai in 6 months or sooner if you have any problems

## 2016-07-08 MED ORDER — BUDESONIDE-FORMOTEROL FUMARATE 160-4.5 MCG/ACT IN AERO: INHALATION_SPRAY | RESPIRATORY_TRACT | 5 refills | Status: DC

## 2016-07-08 NOTE — Addendum Note (Signed)
Addended by: Len Blalock on: 07/08/2016 09:40 AM   Modules accepted: Orders

## 2016-07-30 ENCOUNTER — Telehealth: Payer: Self-pay | Admitting: Emergency Medicine

## 2016-07-30 MED ORDER — PREDNISONE 10 MG PO TABS: ORAL_TABLET | ORAL | 0 refills | Status: DC

## 2016-07-30 NOTE — Telephone Encounter (Signed)
Called spoke with pt spouse. She reports pt feels like he has irritation in his bronchial tubes. C/o wheezing, dry cough. Requesting recs. Reports SOB is no worse. Please advise RB thanks

## 2016-07-30 NOTE — Telephone Encounter (Signed)
Called spoke with pt spouse. Aware of recs. Rx sent in. Nothing further needed

## 2016-07-30 NOTE — Telephone Encounter (Signed)
Please have him start pred:  Take 40mg  daily for 3 days, then 30mg  daily for 3 days, then 20mg  daily for 3 days, then 10mg  daily for 3 days, then stop.  Also he may benefit from OTC cold meds, decongestant like Tylenol cold and flu He needs to call next week and let us know if better

## 2016-08-10 ENCOUNTER — Ambulatory Visit (INDEPENDENT_AMBULATORY_CARE_PROVIDER_SITE_OTHER): Payer: Medicare HMO | Admitting: Adult Health

## 2016-08-10 ENCOUNTER — Encounter: Payer: Self-pay | Admitting: Adult Health

## 2016-08-10 DIAGNOSIS — J4541 Moderate persistent asthma with (acute) exacerbation: Secondary | ICD-10-CM

## 2016-08-10 MED ORDER — AZITHROMYCIN 250 MG PO TABS: ORAL_TABLET | ORAL | 0 refills | Status: AC

## 2016-08-10 MED ORDER — LEVALBUTEROL HCL 0.63 MG/3ML IN NEBU
0.6300 mg | INHALATION_SOLUTION | Freq: Once | RESPIRATORY_TRACT | Status: AC
  Administered 2016-08-10: 0.63 mg via RESPIRATORY_TRACT

## 2016-08-10 NOTE — Addendum Note (Signed)
Addended by: Parke Poisson E on: 08/10/2016 11:26 AM   Modules accepted: Orders

## 2016-08-10 NOTE — Assessment & Plan Note (Addendum)
Slowly resolving flare with bronchitis  xopenex neb x 1   Plan  Patient Instructions  Zpack take as directed.  Mucinex Twice daily  As needed  Congestion  Delsym 2 tsp .Twice daily  As needed  Cough .  Saline nasal rinses As needed   Zyrtec 10mg  At bedtime  As needed  Drainage.  follow up Dr. Lamonte Sakai  As planned and As needed   Please contact office for sooner follow up if symptoms do not improve or worsen or seek emergency care

## 2016-08-10 NOTE — Patient Instructions (Signed)
Zpack take as directed.  Mucinex Twice daily  As needed  Congestion  Delsym 2 tsp .Twice daily  As needed  Cough .  Saline nasal rinses As needed   Zyrtec 10mg  At bedtime  As needed  Drainage.  follow up Dr. Lamonte Sakai  As planned and As needed   Please contact office for sooner follow up if symptoms do not improve or worsen or seek emergency care

## 2016-08-10 NOTE — Progress Notes (Signed)
Subjective:    Patient ID: Sean Lin, male    DOB: [birth year], 81 y.o.   MRN: [id number] HPI 81 yo man, hx former tobacco (8 pk-yrs), CAD/CABG '91, HTN, DM, prostate CA. And Asthma    08/10/2016  Acute OV : :Severe Asthma /R HD paralysis  Patient presents for an acute office visit.  Complains of 2 weeks of cough, congestion , wheezing. Coughing up white/gray mucus.  Called steroid taper 1 week ago, he is feeling some better but cough/congestion are lingering. He denies fever, chest pain, orthopnea , edema or fever.  Appetite is good.  Has been taking cold and allergy med to help .  Wife and son have been sick with similar symptoms.      Past Medical History:  Diagnosis Date  . CAD (coronary artery disease)    A. 03/21/97: s/p CABG x4 (pvt) - [facility] - LAD; SVG -DIAG; SVG-OM; SVG-RCA B. 10/04/2008: EX. MV - EX. TIME 5:15; MAX HR 155; EF 65%; LOW RISK STUDY WITH NL     . Cerebrovascular disease   . Depression   . HLD (hyperlipidemia)   . HTN (hypertension)   . Prostate cancer ([place])    hx  . [place] hemorrhagic fever    pending r tha ??   Current Outpatient Prescriptions on File Prior to Visit  Medication Sig Dispense Refill  . albuterol (VENTOLIN HFA) 108 (90 BASE) MCG/ACT inhaler Inhale 2 puffs into the lungs 2 (two) times daily.      Marland Kitchen aspirin EC 325 MG tablet Take 325 mg by mouth daily.      Marland Kitchen atorvastatin (LIPITOR) 40 MG tablet Take 40 mg by mouth daily.    . budesonide-formoterol (SYMBICORT) 160-4.5 MCG/ACT inhaler INHALE 2 PUFFS BY MOUTH 2 TIMES DAILY 10.2 g 5  . felodipine (PLENDIL) 10 MG 24 hr tablet Take 10 mg by mouth daily. Reported on 10/23/2015    . furosemide (LASIX) 20 MG tablet TAKE ONE TABLET EVERY DAY 30 tablet 6  . glimepiride (AMARYL) 1 MG tablet Take 1 tablet by mouth daily.  5  . losartan-hydrochlorothiazide (HYZAAR) 100-12.5 MG per tablet Take 1 tablet by mouth daily.      Marland Kitchen neomycin-bacitracin-polymyxin (NEOSPORIN) OINT Apply 1 application topically daily.     Marland Kitchen omeprazole (PRILOSEC OTC) 20 MG tablet Take 20 mg by mouth daily.      Marland Kitchen Respiratory Therapy Supplies (FLUTTER) DEVI Use as directed. 1 each 0  . traMADol (ULTRAM) 50 MG tablet Take 100 mg by mouth 3 (three) times daily as needed.      No current facility-administered medications on file prior to visit.         ROS  Constitutional:   No  weight loss, night sweats,  Fevers, chills,  +fatigue, or  lassitude.  HEENT:   No headaches,  Difficulty swallowing,  Tooth/dental problems, or  Sore throat,                No sneezing, itching, ear ache,  +nasal congestion, post nasal drip,   CV:  No chest pain,  Orthopnea, PND, swelling in lower extremities, anasarca, dizziness, palpitations, syncope.   GI  No heartburn, indigestion, abdominal pain, nausea, vomiting, diarrhea, change in bowel habits, loss of appetite, bloody stools.   Resp:    No chest wall deformity  Skin: no rash or lesions.  GU: no dysuria, change in color of urine, no urgency or frequency.  No flank pain, no hematuria   MS:  No  joint pain or swelling.  No decreased range of motion.  No back pain.  Psych:  No change in mood or affect. No depression or anxiety.  No memory loss.       Objective:   Physical Exam  Vitals:   08/10/16 1036  BP: 118/76  BP Location: Left Arm  Cuff Size: Normal  Pulse: 82  Temp: 97.6 F (36.4 C)  TempSrc: Oral  SpO2: 95%  Weight: 190 lb 12.8 oz (86.5 kg)  Height: 5' 9.5" (1.765 m)   GEN: A/Ox3; pleasant , NAD, elderly    HEENT:  [place]/AT,  EACs-clear, TMs-wnl, NOSE-clear, THROAT-clear, no lesions, no postnasal drip or exudate noted.   NECK:  Supple w/ fair ROM; no JVD; normal carotid impulses w/o bruits; no thyromegaly or nodules palpated; no lymphadenopathy.    RESP  Few rhonchi noted ,  no accessory muscle use, no dullness to percussion  CARD:  RRR, no m/r/g  , no peripheral edema, pulses intact, no cyanosis or clubbing.  GI:   Soft & nt; nml bowel sounds; no  organomegaly or masses detected.   Musco: Warm bil, no deformities or joint swelling noted.   Neuro: alert, no focal deficits noted.    Skin: Warm, no lesions or rashes   Tammy Parrett NP-C  [facility]  08/10/2016

## 2016-08-23 ENCOUNTER — Ambulatory Visit (INDEPENDENT_AMBULATORY_CARE_PROVIDER_SITE_OTHER): Payer: Medicare HMO | Admitting: Emergency Medicine

## 2016-08-23 ENCOUNTER — Encounter: Payer: Self-pay | Admitting: Emergency Medicine

## 2016-08-23 DIAGNOSIS — J4541 Moderate persistent asthma with (acute) exacerbation: Secondary | ICD-10-CM | POA: Diagnosis not present

## 2016-08-23 DIAGNOSIS — R05 Cough: Secondary | ICD-10-CM

## 2016-08-23 DIAGNOSIS — R059 Cough, unspecified: Secondary | ICD-10-CM

## 2016-08-23 MED ORDER — FLUTICASONE PROPIONATE 50 MCG/ACT NA SUSP: 2.0000 | Freq: Every day | NASAL | 5 refills | Status: DC

## 2016-08-23 MED ORDER — PREDNISONE 10 MG PO TABS: ORAL_TABLET | ORAL | 0 refills | Status: DC

## 2016-08-23 NOTE — Assessment & Plan Note (Signed)
Continue Symbicort twice a day 

## 2016-08-23 NOTE — Patient Instructions (Addendum)
Please start taking the zyrtec every day on a schedule Stop mucinex Continue your symbicort twice a day Keep delsym available to use as needed for cough.  Start fluticasone nasal spray, 2 sprays each side daily.  Take prednisone as directed until completely gone.  Follow with Dr Lamonte Sakai in November as already planned

## 2016-08-23 NOTE — Assessment & Plan Note (Signed)
His cough and upper airway noises have persisted. He does have some chest congestion. He was recently treated with azithromycin. He never took the Zyrtec on a schedule. I believe he needs to have his allergic rhinitis more aggressively treated area to medicine to take the Zyrtec as we had recommended every day. I also added fluticasone nasal spray every day. I will treated with prednisone taper to try and get his rhinitis and upper airway irritation under control and we'll manage this with her maintenance medicines outlined above. He will continue the Symbicort as he has always tolerated it.

## 2016-08-23 NOTE — Progress Notes (Signed)
Subjective:    Patient ID: Sean Lin, male    DOB: 30-Aug-1935, 81 y.o.   MRN: [id number] HPI ROV 07/07/16 -- this follow-up visit for 81 year old gentleman with history of asthma (minimal tobacco history). Also with a history of right hemidiaphragm paralysis. I haven't seen him since October 2015 but he reestablished care here in March 2017. Most recent Pulmonary  function testing was from 2012 which showed severe obstruction, FEV1 1.28 L (47% predicted) with a ratio of 40%. He was hospitalized for an AE in January. He has been doing well since. He is on Symbicort bid, uses ventolin in conjunction with this. Has not had flu shot yet.   ROV 08/23/16 -- Sean Lin is a history of asthma diagnosed papillary function testing in 2012. He also has a history of right hemidiaphragmatic paralysis. He was seen in our office 2 weeks ago for cough and mucous production, some chest mucous and associated wheeze. Treated with azithromycin, started on Mucinex, Delsym, Zyrtec; hasn't taken the zyrtec regularly. He is on omeprazole. He has continued to have sx, may have had some transient improvement when he was on the azithromycin. He remains on symbicort bid, has usually tolerated it. Denies fevers. Some greenish phlegm.     Objective:   Physical Exam  Vitals:   08/23/16 1158 08/23/16 1159  BP:  108/60  BP Location:  Left Arm  Cuff Size:  Normal  Pulse:  82  SpO2:  93%  Weight: 195 lb (88.5 kg)   Height: 5\' 9"  (1.753 m)      Gen: Pleasant, over wt man, in no distress,  normal affect  ENT: No lesions,  mouth clear,  oropharynx clear, no postnasal drip, some hoarse voice  Neck: No JVD, no TMG, no carotid bruits  Lungs: No use of accessory muscles, He does have some focal right sided rhonchi,  no stridor   Cardiovascular: RRR, heart sounds normal, no murmur or gallops, no peripheral edema  Musculoskeletal: No deformities, no cyanosis or clubbing  Neuro: alert, non focal  Skin: Warm, no lesions  or rashes   Sniff Test 02/21/12:  IMPRESSION:  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 No pathologically  enlarged mediastinal, hilar or axillary lymph nodes are present.  There is stable elevation of the right hemidiaphragm with associated  right basilar atelectasis. No significant pleural or pericardial  effusion is present. The thoracic inlet appears normal.  The multifocal right lung opacities noted on the original study  remain resolved. There is a stable tiny left lower lobe nodule on  image 41. No new or enlarging pulmonary nodules are identified.  There is no confluent airspace opacity.  Images through the upper abdomen demonstrate stable mild hepatic  steatosis and a low-density 1.6 cm right renal lesion on image 59.  There are no worrisome osseous findings.      Assessment & Plan:     Cough His cough and upper airway noises have persisted. He does have some chest congestion. He was recently treated with azithromycin. He never took the Zyrtec on a schedule. I believe he needs to have his allergic rhinitis more aggressively treated area to medicine to take the Zyrtec as we had recommended every day. I also added fluticasone nasal spray every day. I will treated with prednisone taper to try and get his rhinitis and  upper airway irritation under control and we'll manage this with her maintenance medicines outlined above. He will continue the Symbicort as he has always tolerated it.  Asthma, extrinsic, without status asthmaticus Continue Symbicort twice a day   Baltazar Apo, MD, PhD 08/23/2016, 12:25 PM [facility] [phone] or if no answer [phone]

## 2016-09-10 ENCOUNTER — Ambulatory Visit (INDEPENDENT_AMBULATORY_CARE_PROVIDER_SITE_OTHER): Payer: Medicare HMO | Admitting: Emergency Medicine

## 2016-09-10 ENCOUNTER — Encounter: Payer: Self-pay | Admitting: Emergency Medicine

## 2016-09-10 DIAGNOSIS — J4541 Moderate persistent asthma with (acute) exacerbation: Secondary | ICD-10-CM

## 2016-09-10 NOTE — Assessment & Plan Note (Signed)
With a recent flare in the setting of worsening allergy symptoms. He is improved now that he's been treated with prednisone. He has benefited from the areola addition of Flonase and Zyrtec. We will continue this at least during the spring and fall. He may need it all year round. We will determine this as we go forward. Flu shot up to date.

## 2016-09-10 NOTE — Progress Notes (Signed)
Subjective:    Patient ID: Sean Lin, male    DOB: 1935/09/26, 81 y.o.   MRN: [id number] HPI ROV 07/07/16 -- this follow-up visit for 81 year old gentleman with history of asthma (minimal tobacco history). Also with a history of right hemidiaphragm paralysis. I haven't seen him since October 2015 but he reestablished care here in March 2017. Most recent Pulmonary  function testing was from 2012 which showed severe obstruction, FEV1 1.28 L (47% predicted) with a ratio of 40%. He was hospitalized for an AE in January. He has been doing well since. He is on Symbicort bid, uses ventolin in conjunction with this. Has not had flu shot yet.   ROV 08/23/16 -- Sean Lin is a history of asthma diagnosed pulm function testing in 2012. He also has a history of right hemidiaphragmatic paralysis. He was seen in our office 2 weeks ago for cough and mucous production, some chest mucous and associated wheeze. Treated with azithromycin, started on Mucinex, Delsym, Zyrtec; hasn't taken the zyrtec regularly. He is on omeprazole. He has continued to have sx, may have had some transient improvement when he was on the azithromycin. He remains on symbicort bid, has usually tolerated it. Denies fevers. Some greenish phlegm.   ROV 09/10/16 -- This is a follow-up visit for history of asthma, R hemidiaphragmatic paralysis. Also with a history of allergic rhinitis. He was treated with zyrtec and flonase, finished a pred taper. Breathing and cough both improved.  Remains on symbicort bid. Uses albuterol a couple times a day. Flu shot up to date.     Objective:   Physical Exam  Vitals:   09/10/16 0957  BP: 118/60  BP Location: Left Arm  Cuff Size: Normal  Pulse: 70  SpO2: 94%  Weight: 194 lb (88 kg)  Height: 5\' 10"  (1.778 m)     Gen: Pleasant, over wt man, in no distress,  normal affect  ENT: No lesions,  mouth clear,  oropharynx clear, no postnasal drip, some hoarse voice  Neck: No JVD, no TMG, no carotid  bruits  Lungs: No use of accessory muscles, He does have some focal right sided rhonchi,  no stridor   Cardiovascular: RRR, heart sounds normal, no murmur or gallops, no peripheral edema  Musculoskeletal: No deformities, no cyanosis or clubbing  Neuro: alert, non focal  Skin: Warm, no lesions or rashes   Sniff Test 02/21/12:  IMPRESSION:  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 No pathologically  enlarged mediastinal, hilar or axillary lymph nodes are present.  There is stable elevation of the right hemidiaphragm with associated  right basilar atelectasis. No significant pleural or pericardial  effusion is present. The thoracic inlet appears normal.  The multifocal right lung opacities noted on the original study  remain resolved. There is a stable tiny left lower lobe nodule on  image 41. No new or enlarging pulmonary nodules are identified.  There is no confluent airspace opacity.  Images through the upper abdomen demonstrate stable mild hepatic  steatosis and a low-density 1.6 cm right renal lesion on image 59.  There are no worrisome osseous findings.      Assessment & Plan:     Asthma, extrinsic, without status asthmaticus With a recent flare in the setting of worsening allergy symptoms. He is improved now that he's been treated with prednisone. He  has benefited from the areola addition of Flonase and Zyrtec. We will continue this at least during the spring and fall. He may need it all year round. We will determine this as we go forward. Flu shot up to date.    Baltazar Apo, MD, PhD 09/10/2016, 10:13 AM [facility] [phone] or if no answer [phone]

## 2016-09-10 NOTE — Patient Instructions (Signed)
Please continue your Symbicort twice a day Use albuterol 2 puffs up to every 4 hours if needed for shortness of breath.  Continue your zyrtec and flonase as you are taking them at least through the Fall. You should restart this in the Spring.  Flu shot up to date.  Follow with Dr Lamonte Sakai in 4 months or sooner if you have any problems.

## 2016-12-15 ENCOUNTER — Telehealth: Payer: Self-pay | Admitting: Emergency Medicine

## 2016-12-15 ENCOUNTER — Other Ambulatory Visit: Payer: Self-pay | Admitting: Emergency Medicine

## 2016-12-15 NOTE — Telephone Encounter (Signed)
RB  Please Advise-  PT. Called in and stated his inhaler spacer that he has is no longer working well for him, states that he is having a lot of leakage from it and wants to know if he can have a new one

## 2016-12-16 MED ORDER — AEROCHAMBER MV MISC: 0 refills | Status: AC

## 2016-12-16 NOTE — Telephone Encounter (Signed)
Spoke with pt. Made him aware of the message from [place]. Pt. Is aware that he needs to sign for the spacer. He states he will not be able to come in today, The rx was printed and signed by RB. Both forms are stapled to the bag and placed up front in the pick up drawer.

## 2016-12-16 NOTE — Telephone Encounter (Signed)
Ok to order an new spacer for him.

## 2016-12-31 ENCOUNTER — Encounter: Payer: Self-pay | Admitting: Emergency Medicine

## 2016-12-31 ENCOUNTER — Ambulatory Visit (INDEPENDENT_AMBULATORY_CARE_PROVIDER_SITE_OTHER): Payer: Medicare HMO | Admitting: Emergency Medicine

## 2016-12-31 VITALS — BP 112/72 | HR 73 | Ht 70.0 in | Wt 189.4 lb

## 2016-12-31 DIAGNOSIS — J4530 Mild persistent asthma, uncomplicated: Secondary | ICD-10-CM | POA: Diagnosis not present

## 2016-12-31 DIAGNOSIS — J309 Allergic rhinitis, unspecified: Secondary | ICD-10-CM | POA: Insufficient documentation

## 2016-12-31 MED ORDER — CETIRIZINE HCL 10 MG PO TABS: 10.0000 mg | ORAL_TABLET | Freq: Every day | ORAL | 5 refills | Status: DC

## 2016-12-31 NOTE — Progress Notes (Signed)
Subjective:    Patient ID: KIAI PEACE, male    DOB: 1935/05/17, 82 y.o.   MRN: [id number] HPI ROV 07/07/16 -- this follow-up visit for 82 year old gentleman with history of asthma (minimal tobacco history). Also with a history of right hemidiaphragm paralysis. I haven't seen him since October 2015 but he reestablished care here in March 2017. Most recent Pulmonary  function testing was from 2012 which showed severe obstruction, FEV1 1.28 L (47% predicted) with a ratio of 40%. He was hospitalized for an AE in January. He has been doing well since. He is on Symbicort bid, uses ventolin in conjunction with this. Has not had flu shot yet.   ROV 08/23/16 -- Mr. Camilli is a history of asthma diagnosed pulm function testing in 2012. He also has a history of right hemidiaphragmatic paralysis. He was seen in our office 2 weeks ago for cough and mucous production, some chest mucous and associated wheeze. Treated with azithromycin, started on Mucinex, Delsym, Zyrtec; hasn't taken the zyrtec regularly. He is on omeprazole. He has continued to have sx, may have had some transient improvement when he was on the azithromycin. He remains on symbicort bid, has usually tolerated it. Denies fevers. Some greenish phlegm.   ROV 09/10/16 -- This is a follow-up visit for history of asthma, R hemidiaphragmatic paralysis. Also with a history of allergic rhinitis. He was treated with zyrtec and flonase, finished a pred taper. Breathing and cough both improved.  Remains on symbicort bid. Uses albuterol a couple times a day. Flu shot up to date.   ROV 12/31/16 -- patient has a hx asthma, allergic rhinitis, R HD paralysis with associated dyspnea. Reports that he was treated for an acute bronchitis. He developed an increase in head and chest mucous over the last 2 weeks. Was treated with azithromycin and prednisone. He continues to have chest congestion. He is on flonase, has been off zyrtec. On symbicort.     Objective:    Physical Exam  Vitals:   12/31/16 0933  BP: 112/72  BP Location: Left Arm  Cuff Size: Normal  Pulse: 73  SpO2: 92%  Weight: 189 lb 6.4 oz (85.9 kg)  Height: 5\' 10"  (1.778 m)     Gen: Pleasant, over wt man, in no distress,  normal affect  ENT: No lesions,  mouth clear,  oropharynx clear, no postnasal drip, some hoarse voice  Neck: No JVD, no TMG, no carotid bruits  Lungs: No use of accessory muscles, He does have some focal right sided rhonchi,  no stridor   Cardiovascular: RRR, heart sounds normal, no murmur or gallops, no peripheral edema  Musculoskeletal: No deformities, no cyanosis or clubbing  Neuro: alert, non focal  Skin: Warm, no lesions or rashes   Sniff Test 02/21/12:  IMPRESSION:  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 No pathologically  enlarged mediastinal, hilar or axillary lymph nodes are present.  There is stable elevation of the right hemidiaphragm with associated  right basilar atelectasis. No significant pleural or pericardial  effusion is present. The thoracic inlet appears normal.  The multifocal right lung opacities noted on the original study  remain resolved. There is a stable tiny left lower lobe nodule on  image 41. No new or enlarging pulmonary nodules are identified.  There is no confluent airspace opacity.  Images through the  upper abdomen demonstrate stable mild hepatic  steatosis and a low-density 1.6 cm right renal lesion on image 59.  There are no worrisome osseous findings.      Assessment & Plan:     Asthma, extrinsic, without status asthmaticus Recent AE, ? Mostly rhinitis. Treated w pred and azithro.  Continue symbicort  Allergic rhinitis Continue flonase,  Restart zyrtec. ? Whether we can use seasonally or whether he will need year-round.     Baltazar Apo, MD, PhD 12/31/2016, 10:05 AM [facility] [phone] or if no answer [phone]

## 2016-12-31 NOTE — Assessment & Plan Note (Signed)
Recent AE, ? Mostly rhinitis. Treated w pred and azithro.  Continue symbicort

## 2016-12-31 NOTE — Patient Instructions (Signed)
Please continue your Symbicort twice a day.  Restart Zyrtec 10mg  daily Continue flonase 2 sprays each side daily.  Follow with Dr Lamonte Sakai in 3 months or sooner if you have any problems.

## 2016-12-31 NOTE — Assessment & Plan Note (Signed)
Continue flonase,  Restart zyrtec. ? Whether we can use seasonally or whether he will need year-round.

## 2017-01-05 ENCOUNTER — Telehealth: Payer: Self-pay

## 2017-01-05 NOTE — Telephone Encounter (Signed)
Spoke with Butch Penny with [employer] and initated PA for cetitizine HCI. Will await determination.  Ref # [id number].

## 2017-01-10 DIAGNOSIS — C61 Malignant neoplasm of prostate: Secondary | ICD-10-CM | POA: Diagnosis not present

## 2017-01-10 DIAGNOSIS — D72829 Elevated white blood cell count, unspecified: Secondary | ICD-10-CM | POA: Diagnosis not present

## 2017-01-10 DIAGNOSIS — N179 Acute kidney failure, unspecified: Secondary | ICD-10-CM | POA: Diagnosis not present

## 2017-01-10 DIAGNOSIS — K219 Gastro-esophageal reflux disease without esophagitis: Secondary | ICD-10-CM | POA: Diagnosis not present

## 2017-01-10 DIAGNOSIS — J45909 Unspecified asthma, uncomplicated: Secondary | ICD-10-CM | POA: Diagnosis not present

## 2017-01-10 DIAGNOSIS — E119 Type 2 diabetes mellitus without complications: Secondary | ICD-10-CM | POA: Diagnosis not present

## 2017-01-10 DIAGNOSIS — J40 Bronchitis, not specified as acute or chronic: Secondary | ICD-10-CM | POA: Diagnosis not present

## 2017-01-10 DIAGNOSIS — J441 Chronic obstructive pulmonary disease with (acute) exacerbation: Secondary | ICD-10-CM | POA: Diagnosis not present

## 2017-01-10 DIAGNOSIS — I10 Essential (primary) hypertension: Secondary | ICD-10-CM | POA: Diagnosis not present

## 2017-01-11 ENCOUNTER — Ambulatory Visit: Payer: Medicare HMO | Admitting: Emergency Medicine

## 2017-01-11 DIAGNOSIS — J441 Chronic obstructive pulmonary disease with (acute) exacerbation: Secondary | ICD-10-CM | POA: Diagnosis not present

## 2017-01-11 DIAGNOSIS — E119 Type 2 diabetes mellitus without complications: Secondary | ICD-10-CM

## 2017-01-11 DIAGNOSIS — N179 Acute kidney failure, unspecified: Secondary | ICD-10-CM | POA: Diagnosis not present

## 2017-01-11 DIAGNOSIS — D72829 Elevated white blood cell count, unspecified: Secondary | ICD-10-CM | POA: Diagnosis not present

## 2017-01-11 DIAGNOSIS — J45909 Unspecified asthma, uncomplicated: Secondary | ICD-10-CM | POA: Diagnosis not present

## 2017-01-12 NOTE — Telephone Encounter (Signed)
Spoke with the pt and notified of recs per RB  He verbalized understanding and nothing further needed

## 2017-01-12 NOTE — Telephone Encounter (Signed)
Cetirizine 10 mg prior authorization was denied on 01/05/17. Nackiel from [place] [phone]) stated it is due to Medicare part D excluding the medication as it can be purchased OTC. The decision can be appealed up to 60 days. Dr. Lamonte Sakai please advise if you would like to appeal the decision, have pt purchase otc, or change medication. Thank you!   Reference PA number: [id number]

## 2017-01-12 NOTE — Telephone Encounter (Signed)
Unfortunately he will probably need to start getting medication over-the-counter as I do not believe they will approve even with a PA

## 2017-01-19 ENCOUNTER — Telehealth: Payer: Self-pay | Admitting: Emergency Medicine

## 2017-01-19 NOTE — Telephone Encounter (Signed)
Thank you :)

## 2017-01-19 NOTE — Telephone Encounter (Signed)
Spoke with the pt's spouse  She states calling to let Rodey know that pt was admitted to [facility] from 01/09/17 until 01/11/17  She states that he was having SOB and flu like symptoms  He tested neg for flu but PCP thinks he had the flu  He is feeling much improved now  Just an [place]

## 2017-04-04 ENCOUNTER — Encounter: Payer: Self-pay | Admitting: Emergency Medicine

## 2017-04-04 ENCOUNTER — Ambulatory Visit (INDEPENDENT_AMBULATORY_CARE_PROVIDER_SITE_OTHER): Payer: Medicare HMO | Admitting: Emergency Medicine

## 2017-04-04 DIAGNOSIS — J4541 Moderate persistent asthma with (acute) exacerbation: Secondary | ICD-10-CM | POA: Diagnosis not present

## 2017-04-04 DIAGNOSIS — J301 Allergic rhinitis due to pollen: Secondary | ICD-10-CM | POA: Diagnosis not present

## 2017-04-04 NOTE — Assessment & Plan Note (Signed)
Discussed restarting Zyrtec with him at least through the allergy season.

## 2017-04-04 NOTE — Patient Instructions (Signed)
Please continue Symbicort 2 puffs twice a day Restart Zyrtec once a day through the allergy season. Keep albuterol available to use 2 puffs up to every 4 hours if needed for shortness of breath.  Follow with Dr Lamonte Sakai in 6 months or sooner if you have any problems

## 2017-04-04 NOTE — Progress Notes (Signed)
Subjective:    Patient ID: Sean Lin, male    DOB: [birth year], 82 y.o.   MRN: [id number] HPI ROV 09/10/16 -- This is a follow-up visit for history of asthma, R hemidiaphragmatic paralysis. Also with a history of allergic rhinitis. He was treated with zyrtec and flonase, finished a pred taper. Breathing and cough both improved.  Remains on symbicort bid. Uses albuterol a couple times a day. Flu shot up to date.   ROV 12/31/16 -- patient has a hx asthma, allergic rhinitis, R HD paralysis with associated dyspnea. Reports that he was treated for an acute bronchitis. He developed an increase in head and chest mucous over the last 2 weeks. Was treated with azithromycin and prednisone. He continues to have chest congestion. He is on flonase, has been off zyrtec. On symbicort.   ROV 04/04/17 -- This is a follow-up visit for combined obstructive and restrictive disease area he has a history of asthma and also right hemidiaphragm paralysis with associated dyspnea. History Of allergic rhinitis and cough. He reports today that he has been doing fairly well. He is able to do some yard work, uses a Engineer, building services. Mucous has been well controlled. He is not on zyrtec right now.  He is on Symbicort. Rarely uses SABA. No flares since last visit.     Objective:   Physical Exam  Vitals:   04/04/17 1055  BP: 114/62  Pulse: 72  SpO2: 92%  Weight: 190 lb 3.2 oz (86.3 kg)  Height: 5\' 10"  (1.778 m)     Gen: Pleasant, over wt man, in no distress,  normal affect  ENT: No lesions,  mouth clear,  oropharynx clear, no postnasal drip, No hoarseness   Neck: No JVD, no stridor  Lungs: No use of accessory muscles, Some very mild expiratory wheeze that cleared after he coughed.  Cardiovascular: RRR, heart sounds normal, no murmur or gallops, no peripheral edema  Musculoskeletal: No deformities, no cyanosis or clubbing  Neuro: alert, non focal  Skin: Warm, no lesions or rashes   Sniff Test 02/21/12:   IMPRESSION:  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 No pathologically  enlarged mediastinal, hilar or axillary lymph nodes are present.  There is stable elevation of the right hemidiaphragm with associated  right basilar atelectasis. No significant pleural or pericardial  effusion is present. The thoracic inlet appears normal.  The multifocal right lung opacities noted on the original study  remain resolved. There is a stable tiny left lower lobe nodule on  image 41. No new or enlarging pulmonary nodules are identified.  There is no confluent airspace opacity.  Images through the upper abdomen demonstrate stable mild hepatic  steatosis and a low-density 1.6 cm right renal lesion on image 59.  There are no worrisome osseous findings.      Assessment & Plan:     Asthma, extrinsic, without status asthmaticus Please continue Symbicort 2 puffs twice a day Restart Zyrtec once a day through the allergy season. Keep albuterol available to use 2 puffs up to every 4 hours if needed for shortness of breath.  Follow with Dr Lamonte Sakai in 6 months or sooner if you have any problems  Allergic rhinitis Discussed restarting Zyrtec with him at least through the allergy season.   Baltazar Apo, MD, PhD 04/04/2017, 11:13 AM [facility]  [phone] or if no answer [phone]

## 2017-04-04 NOTE — Assessment & Plan Note (Signed)
Please continue Symbicort 2 puffs twice a day Restart Zyrtec once a day through the allergy season. Keep albuterol available to use 2 puffs up to every 4 hours if needed for shortness of breath.  Follow with Dr Lamonte Sakai in 6 months or sooner if you have any problems

## 2017-04-07 ENCOUNTER — Telehealth: Payer: Self-pay | Admitting: Emergency Medicine

## 2017-04-07 NOTE — Telephone Encounter (Signed)
Pt wife is requesting an alternative for Zyrtec. Pt wife states that the Zyrtec is too sedating and all he does is sleep after taking this medication. Pt takes this in the mornings. Please advise Dr Lamonte Sakai. Thanks.   [facility]

## 2017-04-08 NOTE — Telephone Encounter (Signed)
Don't know if they have tried loratadine, but that is probably the least sedating of the anti-histamines. Would try that

## 2017-04-08 NOTE — Telephone Encounter (Signed)
Will forward back to RB.  Please advise.

## 2017-04-08 NOTE — Telephone Encounter (Signed)
Spoke with pt's wife, Mechele Claude. She is aware of RB's recommendation. Nothing further was needed.

## 2017-07-14 ENCOUNTER — Other Ambulatory Visit: Payer: Self-pay | Admitting: Emergency Medicine

## 2017-10-03 ENCOUNTER — Encounter: Payer: Self-pay | Admitting: Emergency Medicine

## 2017-10-03 ENCOUNTER — Ambulatory Visit: Payer: Medicare HMO | Admitting: Emergency Medicine

## 2017-10-03 DIAGNOSIS — J4541 Moderate persistent asthma with (acute) exacerbation: Secondary | ICD-10-CM | POA: Diagnosis not present

## 2017-10-03 DIAGNOSIS — K219 Gastro-esophageal reflux disease without esophagitis: Secondary | ICD-10-CM | POA: Diagnosis not present

## 2017-10-03 DIAGNOSIS — J986 Disorders of diaphragm: Secondary | ICD-10-CM | POA: Diagnosis not present

## 2017-10-03 DIAGNOSIS — J301 Allergic rhinitis due to pollen: Secondary | ICD-10-CM

## 2017-10-03 NOTE — Progress Notes (Signed)
  Subjective:    Patient ID: Sean Lin, male    DOB: 1935-05-06, 82 y.o.   MRN: [id number] HPI  ROV 10/03/17 --82 year old gentleman with a history of combined obstructive and restrictive lung disease in the setting of asthma and a paralyzed right hemidiaphragm.  He also has chronic cough with associated allergic rhinitis.  He returns today for regular follow-up. He reports that he is feeling well, is able to get out and walk. He is able to run a riding mower. He is not doing a lot of coughing currently. He is off zyrtec - causes sleepiness. Uses loratadine prn. No real wheeze. Remains on symbicort, feels that he benefits. He uses albuterol about 2x a day, usually on a schedule w the symbicort. Flu shot up to date.    Objective:   Physical Exam  Vitals:   10/03/17 1024  BP: 134/60  Pulse: 78  SpO2: 93%  Weight: 188 lb 9.6 oz (85.5 kg)  Height: 5\' 10"  (1.778 m)     Gen: Pleasant, over wt man, in no distress,  normal affect  ENT: No lesions,  mouth clear,  oropharynx clear, no postnasal drip, No hoarseness   Neck: No JVD, no stridor  Lungs: No use of accessory muscles, Some very mild expiratory wheeze that cleared after he coughed.  Cardiovascular: RRR, heart sounds normal, no murmur or gallops, no peripheral edema  Musculoskeletal: No deformities, no cyanosis or clubbing  Neuro: alert, non focal  Skin: Warm, no lesions or rashes   Sniff Test 02/21/12:  IMPRESSION:  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 No pathologically  enlarged mediastinal, hilar or axillary lymph nodes are present.  There is stable elevation of the right hemidiaphragm with associated  right basilar atelectasis. No significant pleural or pericardial  effusion is present. The thoracic inlet appears normal.  The multifocal  right lung opacities noted on the original study  remain resolved. There is a stable tiny left lower lobe nodule on  image 41. No new or enlarging pulmonary nodules are identified.  There is no confluent airspace opacity.  Images through the upper abdomen demonstrate stable mild hepatic  steatosis and a low-density 1.6 cm right renal lesion on image 59.  There are no worrisome osseous findings.      Assessment & Plan:     Asthma, extrinsic, without status asthmaticus Please continue Symbicort 2 puffs twice a day as you have been taking it.  Remember to rinse and gargle after using. Keep albuterol available to use 2 puffs as needed for shortness of breath, cough, wheezing, chest tightness. Flu shot up-to-date Follow with Dr Lamonte Sakai in 6 months or sooner if you have any problems  Diaphragm paralysis Continue good pulmonary hygiene  Allergic rhinitis Keep loratadine (Claritin) available to use once a day if needed for your allergy symptoms or congestion.  GERD (gastroesophageal reflux disease) Continue omeprazole 20 mg daily as he has been taking it   Baltazar Apo, MD, PhD 10/03/2017, 10:42 AM [facility] [phone] or if no answer [phone]

## 2017-10-03 NOTE — Assessment & Plan Note (Signed)
Please continue Symbicort 2 puffs twice a day as you have been taking it.  Remember to rinse and gargle after using. Keep albuterol available to use 2 puffs as needed for shortness of breath, cough, wheezing, chest tightness. Flu shot up-to-date Follow with Dr Lamonte Sakai in 6 months or sooner if you have any problems

## 2017-10-03 NOTE — Assessment & Plan Note (Signed)
Keep loratadine (Claritin) available to use once a day if needed for your allergy symptoms or congestion.

## 2017-10-03 NOTE — Assessment & Plan Note (Signed)
Continue omeprazole 20 mg daily as he has been taking it

## 2017-10-03 NOTE — Patient Instructions (Addendum)
Please continue Symbicort 2 puffs twice a day as you have been taking it.  Remember to rinse and gargle after using. Keep albuterol available to use 2 puffs as needed for shortness of breath, cough, wheezing, chest tightness. Keep loratadine (Claritin) available to use once a day if needed for your allergy symptoms or congestion. Flu shot up-to-date Continue Prilosec 20 mg once a day. Flu shot up-to-date Follow with Dr Lamonte Sakai in 6 months or sooner if you have any problems

## 2017-10-03 NOTE — Assessment & Plan Note (Signed)
Continue good pulmonary hygiene

## 2017-12-17 ENCOUNTER — Other Ambulatory Visit: Payer: Self-pay | Admitting: Emergency Medicine

## 2018-03-29 ENCOUNTER — Encounter: Payer: Self-pay | Admitting: Adult Health

## 2018-03-29 ENCOUNTER — Ambulatory Visit: Payer: Medicare HMO | Admitting: Adult Health

## 2018-03-29 ENCOUNTER — Ambulatory Visit (INDEPENDENT_AMBULATORY_CARE_PROVIDER_SITE_OTHER)
Admission: RE | Admit: 2018-03-29 | Discharge: 2018-03-29 | Disposition: A | Discharge status: Home / Self Care | Payer: Medicare HMO | Source: Ambulatory Visit | Attending: Adult Health | Admitting: Adult Health

## 2018-03-29 VITALS — BP 108/54 | HR 88 | Temp 98.8°F | Ht 69.0 in | Wt 187.2 lb

## 2018-03-29 DIAGNOSIS — J309 Allergic rhinitis, unspecified: Secondary | ICD-10-CM

## 2018-03-29 DIAGNOSIS — J4541 Moderate persistent asthma with (acute) exacerbation: Secondary | ICD-10-CM

## 2018-03-29 MED ORDER — PREDNISONE 10 MG PO TABS: ORAL_TABLET | ORAL | 0 refills | Status: DC

## 2018-03-29 MED ORDER — AZITHROMYCIN 250 MG PO TABS: ORAL_TABLET | ORAL | 0 refills | Status: AC

## 2018-03-29 NOTE — Progress Notes (Signed)
@Patient  ID: Sean Lin, male    DOB: 11/16/1934, 83 y.o.   MRN: [id number]  Chief Complaint  Patient presents with  . Acute Visit    Asthma     Referring provider: Raina Mina., MD  HPI: 83 year old male former smoker followed for severe asthma, right hemidiaphragm paralysis Past medical history significant for coronary disease and CABG, hypertension, diabetes, prostate cancer  03/29/2018 Acute OV : Cough  Pt presents for an acute office visit . Complains of 2 days of cough , wheezing and throat drainage . Increased albuterol use. Took 1 of wife prednisone 10mg  tabs yesterday .  Remains on Symbicort Twice daily  . Coughing up very thick mucus .  Has a lot of throat drainage.  Was exposed to several family members that were sick.    Allergies  Allergen Reactions  . Codeine     nausea  . Hydrocodone     REACTION: nausea  . Levofloxacin     REACTION: nausea    Immunization History  Administered Date(s) Administered  . Influenza Split 06/26/2012, 08/02/2015, 07/04/2017  . Influenza Whole 07/03/2011  . Influenza, High Dose Seasonal PF 08/01/2013  . Influenza,inj,Quad PF,6+ Mos 08/01/2014, 07/07/2016  . Pneumococcal Conjugate-13 08/02/2015  . Pneumococcal Polysaccharide-23 11/02/2003    Past Medical History:  Diagnosis Date  . CAD (coronary artery disease)    A. 03/21/97: s/p CABG x4 (pvt) - [place] - LAD; SVG -DIAG; SVG-OM; SVG-RCA B. 10/04/2008: EX. MV - EX. TIME 5:15; MAX HR 155; EF 65%; LOW RISK STUDY WITH NL     . Cerebrovascular disease   . Depression   . HLD (hyperlipidemia)   . HTN (hypertension)   . Prostate cancer ([place])    hx  . [place] hemorrhagic fever    pending r tha ??    Tobacco History: Social History   Tobacco Use  Smoking Status Former Smoker  . Packs/day: 0.50  . Years: 15.00  . Pack years: 7.50  . Types: Cigarettes  . Last attempt to quit: 11/02/1983  . Years since quitting: 34.4  Smokeless Tobacco Never Used  Tobacco Comment   quit  in 1989. Smoked for 40 years, up to two packs a day    Counseling given: Not Answered Comment: quit in 1989. Smoked for 40 years, up to two packs a day    Outpatient Encounter Medications as of 03/29/2018  Medication Sig  . albuterol (VENTOLIN HFA) 108 (90 BASE) MCG/ACT inhaler Inhale 2 puffs into the lungs 2 (two) times daily.    Marland Kitchen aspirin EC 325 MG tablet Take 325 mg by mouth daily.    Marland Kitchen atorvastatin (LIPITOR) 40 MG tablet Take 40 mg by mouth daily.  . felodipine (PLENDIL) 10 MG 24 hr tablet Take 10 mg by mouth daily. Reported on 10/23/2015  . FLUOCINONIDE EX Apply topically. 2-3 times a day  . furosemide (LASIX) 20 MG tablet TAKE ONE TABLET EVERY DAY  . glimepiride (AMARYL) 1 MG tablet Take 1 tablet by mouth daily.  Marland Kitchen losartan-hydrochlorothiazide (HYZAAR) 100-12.5 MG per tablet Take 1 tablet by mouth daily.    Marland Kitchen neomycin-bacitracin-polymyxin (NEOSPORIN) OINT Apply 1 application topically daily.  Marland Kitchen omeprazole (PRILOSEC OTC) 20 MG tablet Take 20 mg by mouth daily.    Marland Kitchen Respiratory Therapy Supplies (FLUTTER) DEVI Use as directed.  Marland Kitchen Spacer/Aero-Holding Chambers (AEROCHAMBER MV) inhaler Use as instructed  . SYMBICORT 160-4.5 MCG/ACT inhaler INHALE 2 PUFFS BY MOUTH 2 TIMES DAILY  . traMADol (ULTRAM) 50 MG tablet  Take 100 mg by mouth 3 (three) times daily as needed.   Marland Kitchen azithromycin (ZITHROMAX Z-PAK) 250 MG tablet Take 2 tablets (500 mg) on  Day 1,  followed by 1 tablet (250 mg) once daily on Days 2 through 5.  . predniSONE (DELTASONE) 10 MG tablet 2 tabs daily for 4 days   No facility-administered encounter medications on file as of 03/29/2018.      Review of Systems  Constitutional:   No  weight loss, night sweats,  Fevers, chills, fatigue, or  lassitude.  HEENT:   No headaches,  Difficulty swallowing,  Tooth/dental problems, or  Sore throat,                No sneezing, itching, ear ache,  +nasal congestion, post nasal drip,   CV:  No chest pain,  Orthopnea, PND, swelling in lower  extremities, anasarca, dizziness, palpitations, syncope.   GI  No heartburn, indigestion, abdominal pain, nausea, vomiting, diarrhea, change in bowel habits, loss of appetite, bloody stools.   Resp:    No chest wall deformity  Skin: no rash or lesions.  GU: no dysuria, change in color of urine, no urgency or frequency.  No flank pain, no hematuria   MS:  No joint pain or swelling.  No decreased range of motion.  No back pain.    Physical Exam  BP (!) 108/54 (BP Location: Left Arm, Cuff Size: Normal)   Pulse 88   Temp 98.8 F (37.1 C) (Oral)   Ht 5\' 9"  (1.753 m)   Wt 187 lb 3.2 oz (84.9 kg)   SpO2 93%   BMI 27.64 kg/m   GEN: A/Ox3; pleasant , NAD, elderly    HEENT:  [place]/AT,  EACs-clear, TMs-wnl, NOSE-clear drainage  THROAT-clear, no lesions, no postnasal drip or exudate noted.   NECK:  Supple w/ fair ROM; no JVD; normal carotid impulses w/o bruits; no thyromegaly or nodules palpated; no lymphadenopathy.    RESP  Clear  P & A; w/o, wheezes/ rales/ or rhonchi. no accessory muscle use, no dullness to percussion  CARD:  RRR, no m/r/g, no peripheral edema, pulses intact, no cyanosis or clubbing.  GI:   Soft & nt; nml bowel sounds; no organomegaly or masses detected.   Musco: Warm bil, no deformities or joint swelling noted.   Neuro: alert, no focal deficits noted.    Skin: Warm, no lesions or rashes    Lab Results:  CBC  BMET   BNP No results found for: BNP  ProBNP No results found for: PROBNP  Imaging: Dg Chest 2 View  Result Date: 03/29/2018 CLINICAL DATA:  Chronic cough, chest congestion with increased severity recently. Former smoker. History of coronary artery disease and CABG, asthma. EXAM: CHEST - 2 VIEW COMPARISON:  Portable chest x-ray of January 09, 2017 FINDINGS: There is hazy increased density which projects in the right middle lobe more conspicuous today. The right hemidiaphragm is mildly elevated. Bowel loops interposed themselves between the liver  and the right hemidiaphragm. The left lung is clear. The heart and pulmonary vascularity are normal. There are post CABG changes. The upper 2 sternal wires are chronically broken. There is calcification in the wall of the aortic arch. The bony thorax exhibits no acute abnormality. IMPRESSION: Chronic elevation of the right hemidiaphragm. New increased density most likely in the middle lobe is worrisome for pneumonia. No CHF. Followup PA and lateral chest X-ray is recommended in 3-4 weeks following trial of antibiotic therapy to ensure resolution and  exclude underlying malignancy. Thoracic aortic atherosclerosis.  Previous CABG. Electronically Signed   By: David  Martinique M.D.   On: 03/29/2018 12:32     Assessment & Plan:   Asthma, extrinsic, without status asthmaticus Flare with URI Check cxr   Plan  Patient Instructions  Zpack take as directed. -to have on hold if symptoms persist with discolored mucus .  Prednisone 20mg  daily for 4 days .  Mucinex Twice daily  As needed  Congestion  Delsym 2 tsp .Twice daily  As needed  Cough .  Saline nasal rinses As needed   Zyrtec 10mg  At bedtime  As needed  Drainage.  Follow up Dr. Lamonte Sakai  In 3 months and As needed   Please contact office for sooner follow up if symptoms do not improve or worsen or seek emergency care       Allergic rhinitis Flare   Plan Patient Instructions  Zpack take as directed. -to have on hold if symptoms persist with discolored mucus .  Prednisone 20mg  daily for 4 days .  Mucinex Twice daily  As needed  Congestion  Delsym 2 tsp .Twice daily  As needed  Cough .  Saline nasal rinses As needed   Zyrtec 10mg  At bedtime  As needed  Drainage.  Follow up Dr. Lamonte Sakai  In 3 months and As needed   Please contact office for sooner follow up if symptoms do not improve or worsen or seek emergency care         Rexene Edison, NP 03/29/2018

## 2018-03-29 NOTE — Assessment & Plan Note (Signed)
Flare with URI Check cxr   Plan  Patient Instructions  Zpack take as directed. -to have on hold if symptoms persist with discolored mucus .  Prednisone 20mg  daily for 4 days .  Mucinex Twice daily  As needed  Congestion  Delsym 2 tsp .Twice daily  As needed  Cough .  Saline nasal rinses As needed   Zyrtec 10mg  At bedtime  As needed  Drainage.  Follow up Dr. Lamonte Sakai  In 3 months and As needed   Please contact office for sooner follow up if symptoms do not improve or worsen or seek emergency care

## 2018-03-29 NOTE — Assessment & Plan Note (Signed)
Flare   Plan Patient Instructions  Zpack take as directed. -to have on hold if symptoms persist with discolored mucus .  Prednisone 20mg  daily for 4 days .  Mucinex Twice daily  As needed  Congestion  Delsym 2 tsp .Twice daily  As needed  Cough .  Saline nasal rinses As needed   Zyrtec 10mg  At bedtime  As needed  Drainage.  Follow up Dr. Lamonte Sakai  In 3 months and As needed   Please contact office for sooner follow up if symptoms do not improve or worsen or seek emergency care

## 2018-03-29 NOTE — Patient Instructions (Addendum)
Zpack take as directed. -to have on hold if symptoms persist with discolored mucus .  Prednisone 20mg  daily for 4 days .  Mucinex Twice daily  As needed  Congestion  Delsym 2 tsp .Twice daily  As needed  Cough .  Saline nasal rinses As needed   Zyrtec 10mg  At bedtime  As needed  Drainage.  Follow up Dr. Lamonte Sakai  In 3 months and As needed   Please contact office for sooner follow up if symptoms do not improve or worsen or seek emergency care

## 2018-04-13 ENCOUNTER — Encounter: Payer: Self-pay | Admitting: Adult Health

## 2018-04-13 ENCOUNTER — Ambulatory Visit: Payer: Medicare HMO | Admitting: Adult Health

## 2018-04-13 ENCOUNTER — Ambulatory Visit (INDEPENDENT_AMBULATORY_CARE_PROVIDER_SITE_OTHER)
Admission: RE | Admit: 2018-04-13 | Discharge: 2018-04-13 | Disposition: A | Discharge status: Home / Self Care | Payer: Medicare HMO | Source: Ambulatory Visit | Attending: Adult Health | Admitting: Adult Health

## 2018-04-13 DIAGNOSIS — J189 Pneumonia, unspecified organism: Secondary | ICD-10-CM

## 2018-04-13 DIAGNOSIS — J4540 Moderate persistent asthma, uncomplicated: Secondary | ICD-10-CM

## 2018-04-13 MED ORDER — IPRATROPIUM BROMIDE 0.02 % IN SOLN: 0.5000 mg | Freq: Three times a day (TID) | RESPIRATORY_TRACT | 5 refills | Status: DC

## 2018-04-13 NOTE — Progress Notes (Signed)
@Patient  ID: Sean Lin, male    DOB: 1935/01/19, 83 y.o.   MRN: [id number]  Chief Complaint  Patient presents with  . Follow-up    Asthma     Referring provider: Raina Mina., MD  HPI: 83 year old male former smoker followed for severe asthma, right hemidiaphragm paralysis Past medical history significant for coronary artery disease status post CABG, hypertension, diabetes, prostate cancer  04/13/2018 Follow up : AB /PNA Patient returns for a 2-week follow-up.  Patient was seen last visit for a asthmatic bronchitic exacerbation.  He was treated with a Z-Pak and a prednisone burst.  Chest x-ray showed possible right middle lobe pneumonia.  says he is feeling much better.  Cough and congestion have decreased.  Appetite remains good.  Says his energy level is picking back up.  He denies any hemoptysis, fever, chest pain, orthopnea. Chest x-ray today shows chronic changes , no acute infiltrate .   Allergies  Allergen Reactions  . Codeine     nausea  . Hydrocodone     REACTION: nausea  . Levofloxacin     REACTION: nausea    Immunization History  Administered Date(s) Administered  . Influenza Split 06/26/2012, 08/02/2015, 07/04/2017  . Influenza Whole 07/03/2011  . Influenza, High Dose Seasonal PF 08/01/2013  . Influenza,inj,Quad PF,6+ Mos 08/01/2014, 07/07/2016  . Pneumococcal Conjugate-13 08/02/2015  . Pneumococcal Polysaccharide-23 11/02/2003    Past Medical History:  Diagnosis Date  . CAD (coronary artery disease)    A. 03/21/97: s/p CABG x4 (pvt) - [facility] - LAD; SVG -DIAG; SVG-OM; SVG-RCA B. 10/04/2008: EX. MV - EX. TIME 5:15; MAX HR 155; EF 65%; LOW RISK STUDY WITH NL     . Cerebrovascular disease   . Depression   . HLD (hyperlipidemia)   . HTN (hypertension)   . Prostate cancer ([place])    hx  . [place] hemorrhagic fever    pending r tha ??    Tobacco History: Social History   Tobacco Use  Smoking Status Former Smoker  . Packs/day: 0.50  . Years: 15.00  .  Pack years: 7.50  . Types: Cigarettes  . Last attempt to quit: 11/02/1983  . Years since quitting: 34.4  Smokeless Tobacco Never Used  Tobacco Comment   quit in 1989. Smoked for 40 years, up to two packs a day    Counseling given: Not Answered Comment: quit in 1989. Smoked for 40 years, up to two packs a day    Outpatient Encounter Medications as of 04/13/2018  Medication Sig  . albuterol (VENTOLIN HFA) 108 (90 BASE) MCG/ACT inhaler Inhale 2 puffs into the lungs 2 (two) times daily.    Marland Kitchen aspirin EC 325 MG tablet Take 325 mg by mouth daily.    Marland Kitchen atorvastatin (LIPITOR) 40 MG tablet Take 40 mg by mouth daily.  . felodipine (PLENDIL) 10 MG 24 hr tablet Take 10 mg by mouth daily. Reported on 10/23/2015  . FLUOCINONIDE EX Apply topically. 2-3 times a day  . furosemide (LASIX) 20 MG tablet TAKE ONE TABLET EVERY DAY  . glimepiride (AMARYL) 1 MG tablet Take 1 tablet by mouth daily.  Marland Kitchen losartan-hydrochlorothiazide (HYZAAR) 100-12.5 MG per tablet Take 1 tablet by mouth daily.    Marland Kitchen neomycin-bacitracin-polymyxin (NEOSPORIN) OINT Apply 1 application topically daily.  Marland Kitchen omeprazole (PRILOSEC OTC) 20 MG tablet Take 20 mg by mouth daily.    Marland Kitchen Respiratory Therapy Supplies (FLUTTER) DEVI Use as directed.  Marland Kitchen Spacer/Aero-Holding Chambers (AEROCHAMBER MV) inhaler Use as instructed  .  SYMBICORT 160-4.5 MCG/ACT inhaler INHALE 2 PUFFS BY MOUTH 2 TIMES DAILY  . traMADol (ULTRAM) 50 MG tablet Take 100 mg by mouth 3 (three) times daily as needed.   . [DISCONTINUED] predniSONE (DELTASONE) 10 MG tablet 2 tabs daily for 4 days  . [DISCONTINUED] ipratropium (ATROVENT) 0.02 % nebulizer solution Take 2.5 mLs (0.5 mg total) by nebulization 3 (three) times daily.   No facility-administered encounter medications on file as of 04/13/2018.      Review of Systems  Constitutional:   No  weight loss, night sweats,  Fevers, chills, fatigue, or  lassitude.  HEENT:   No headaches,  Difficulty swallowing,  Tooth/dental  problems, or  Sore throat,                No sneezing, itching, ear ache, nasal congestion, post nasal drip,   CV:  No chest pain,  Orthopnea, PND, swelling in lower extremities, anasarca, dizziness, palpitations, syncope.   GI  No heartburn, indigestion, abdominal pain, nausea, vomiting, diarrhea, change in bowel habits, loss of appetite, bloody stools.   Resp:   No chest wall deformity  Skin: no rash or lesions.  GU: no dysuria, change in color of urine, no urgency or frequency.  No flank pain, no hematuria   MS:  No joint pain or swelling.  No decreased range of motion.  No back pain.    Physical Exam  BP 122/80 (BP Location: Left Arm, Cuff Size: Normal)   Pulse 67   Ht 5\' 10"  (1.778 m)   Wt 188 lb 3.2 oz (85.4 kg)   SpO2 94%   BMI 27.00 kg/m   GEN: A/Ox3; pleasant , NAD, elderly    HEENT:  [place]/AT,  EACs-clear, TMs-wnl, NOSE-clear, THROAT-clear, no lesions, no postnasal drip or exudate noted.   NECK:  Supple w/ fair ROM; no JVD; normal carotid impulses w/o bruits; no thyromegaly or nodules palpated; no lymphadenopathy.    RESP  Decreased BS in bases  no accessory muscle use, no dullness to percussion  CARD:  RRR, no m/r/g, tr  peripheral edema, pulses intact, no cyanosis or clubbing.  GI:   Soft & nt; nml bowel sounds; no organomegaly or masses detected.   Musco: Warm bil, no deformities or joint swelling noted.   Neuro: alert, no focal deficits noted.    Skin: Warm, no lesions or rashes    Lab Results:  CBC  BMET  BNP No results found for: BNP  ProBNP No results found for: PROBNP  Imaging: Dg Chest 2 View  Result Date: 04/13/2018 CLINICAL DATA:  Follow-up pneumonia EXAM: CHEST - 2 VIEW COMPARISON:  03/29/2018 FINDINGS: Prior CABG. Elevation of the right hemidiaphragm. Chronic right basilar scarring or atelectasis. Left lung clear. No effusions or acute bony abnormality. IMPRESSION: Stable chronic elevation of the right hemidiaphragm with right base  atelectasis or scarring. Prior CABG. No active disease. Electronically Signed   By: Rolm Baptise M.D.   On: 04/13/2018 09:49   Dg Chest 2 View  Result Date: 03/29/2018 CLINICAL DATA:  Chronic cough, chest congestion with increased severity recently. Former smoker. History of coronary artery disease and CABG, asthma. EXAM: CHEST - 2 VIEW COMPARISON:  Portable chest x-ray of January 09, 2017 FINDINGS: There is hazy increased density which projects in the right middle lobe more conspicuous today. The right hemidiaphragm is mildly elevated. Bowel loops interposed themselves between the liver and the right hemidiaphragm. The left lung is clear. The heart and pulmonary vascularity are normal. There  are post CABG changes. The upper 2 sternal wires are chronically broken. There is calcification in the wall of the aortic arch. The bony thorax exhibits no acute abnormality. IMPRESSION: Chronic elevation of the right hemidiaphragm. New increased density most likely in the middle lobe is worrisome for pneumonia. No CHF. Followup PA and lateral chest X-ray is recommended in 3-4 weeks following trial of antibiotic therapy to ensure resolution and exclude underlying malignancy. Thoracic aortic atherosclerosis.  Previous CABG. Electronically Signed   By: David  Martinique M.D.   On: 03/29/2018 12:32     Assessment & Plan:   Asthma, extrinsic, without status asthmaticus Recent flare with bronchitis/early pneumonia.  Now resolved after receiving antibiotics and steroids  Plan  Patient Instructions  Continue on Symbicort 2 puffs Twice daily  , rinse after use .  Mucinex Twice daily  As needed  Congestion  Delsym 2 tsp .Twice daily  As needed  Cough .  Saline nasal rinses As needed   Zyrtec 10mg  At bedtime  As needed  Drainage.  Follow up Dr. Lamonte Sakai  In 3 months and As needed   Please contact office for sooner follow up if symptoms do not improve or worsen or seek emergency care       Pneumonia Clinically improved  with abx  cxr shows no residual pna   Plan  Patient Instructions  Continue on Symbicort 2 puffs Twice daily  , rinse after use .  Mucinex Twice daily  As needed  Congestion  Delsym 2 tsp .Twice daily  As needed  Cough .  Saline nasal rinses As needed   Zyrtec 10mg  At bedtime  As needed  Drainage.  Follow up Dr. Lamonte Sakai  In 3 months and As needed   Please contact office for sooner follow up if symptoms do not improve or worsen or seek emergency care          Rexene Edison, NP 04/13/2018

## 2018-04-13 NOTE — Patient Instructions (Addendum)
Continue on Symbicort 2 puffs Twice daily  , rinse after use .  Mucinex Twice daily  As needed  Congestion  Delsym 2 tsp .Twice daily  As needed  Cough .  Saline nasal rinses As needed   Zyrtec 10mg  At bedtime  As needed  Drainage.  Follow up Dr. Lamonte Sakai  In 3 months and As needed   Please contact office for sooner follow up if symptoms do not improve or worsen or seek emergency care

## 2018-04-13 NOTE — Assessment & Plan Note (Signed)
Clinically improved with abx  cxr shows no residual pna   Plan  Patient Instructions  Continue on Symbicort 2 puffs Twice daily  , rinse after use .  Mucinex Twice daily  As needed  Congestion  Delsym 2 tsp .Twice daily  As needed  Cough .  Saline nasal rinses As needed   Zyrtec 10mg  At bedtime  As needed  Drainage.  Follow up Dr. Lamonte Sakai  In 3 months and As needed   Please contact office for sooner follow up if symptoms do not improve or worsen or seek emergency care

## 2018-04-13 NOTE — Assessment & Plan Note (Signed)
Recent flare with bronchitis/early pneumonia.  Now resolved after receiving antibiotics and steroids  Plan  Patient Instructions  Continue on Symbicort 2 puffs Twice daily  , rinse after use .  Mucinex Twice daily  As needed  Congestion  Delsym 2 tsp .Twice daily  As needed  Cough .  Saline nasal rinses As needed   Zyrtec 10mg  At bedtime  As needed  Drainage.  Follow up Dr. Lamonte Sakai  In 3 months and As needed   Please contact office for sooner follow up if symptoms do not improve or worsen or seek emergency care

## 2018-04-20 ENCOUNTER — Other Ambulatory Visit: Payer: Self-pay | Admitting: Emergency Medicine

## 2018-06-02 IMAGING — DX DG CHEST 2V
2 series · 2 of 2 positions shown · non-contrast
Comparison: Portable chest x-ray January 09, 2017

CLINICAL DATA: Chronic cough, chest congestion with increased
severity recently. Former smoker. History of coronary artery disease
and CABG, asthma.

EXAM:
CHEST - 2 VIEW

[chest pa]
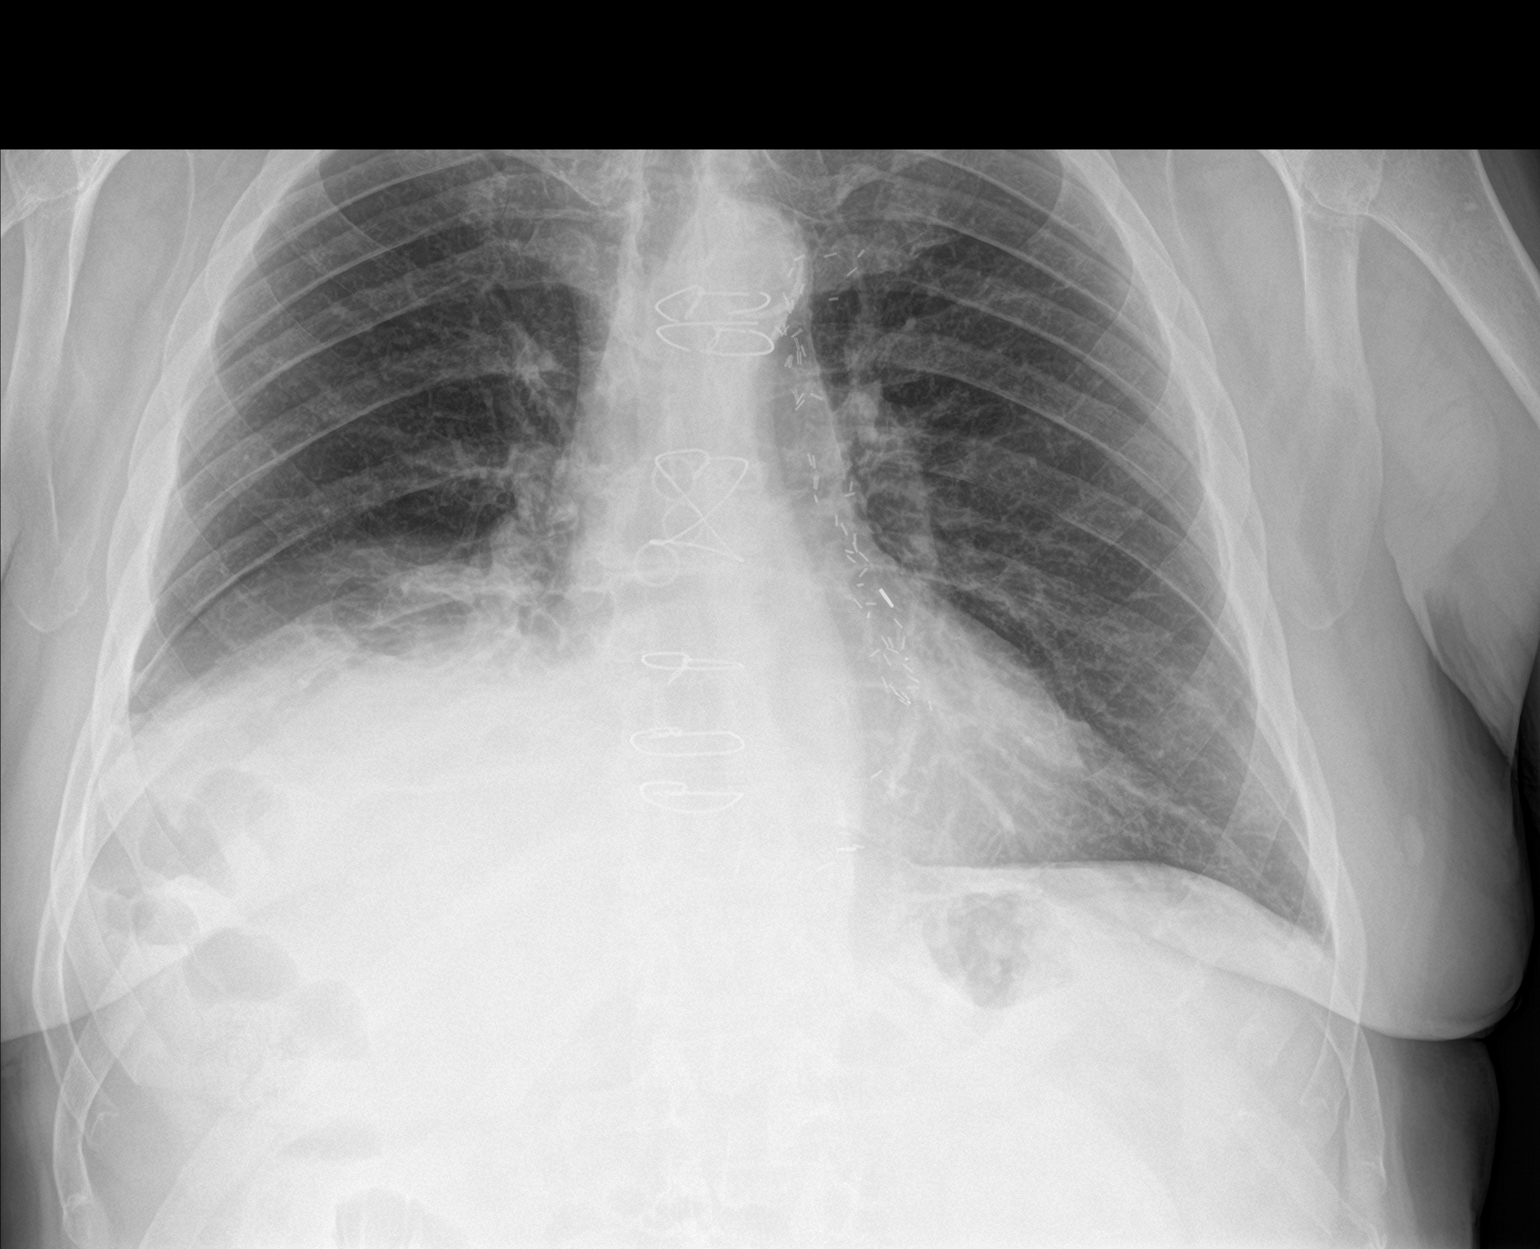

[chest lat]
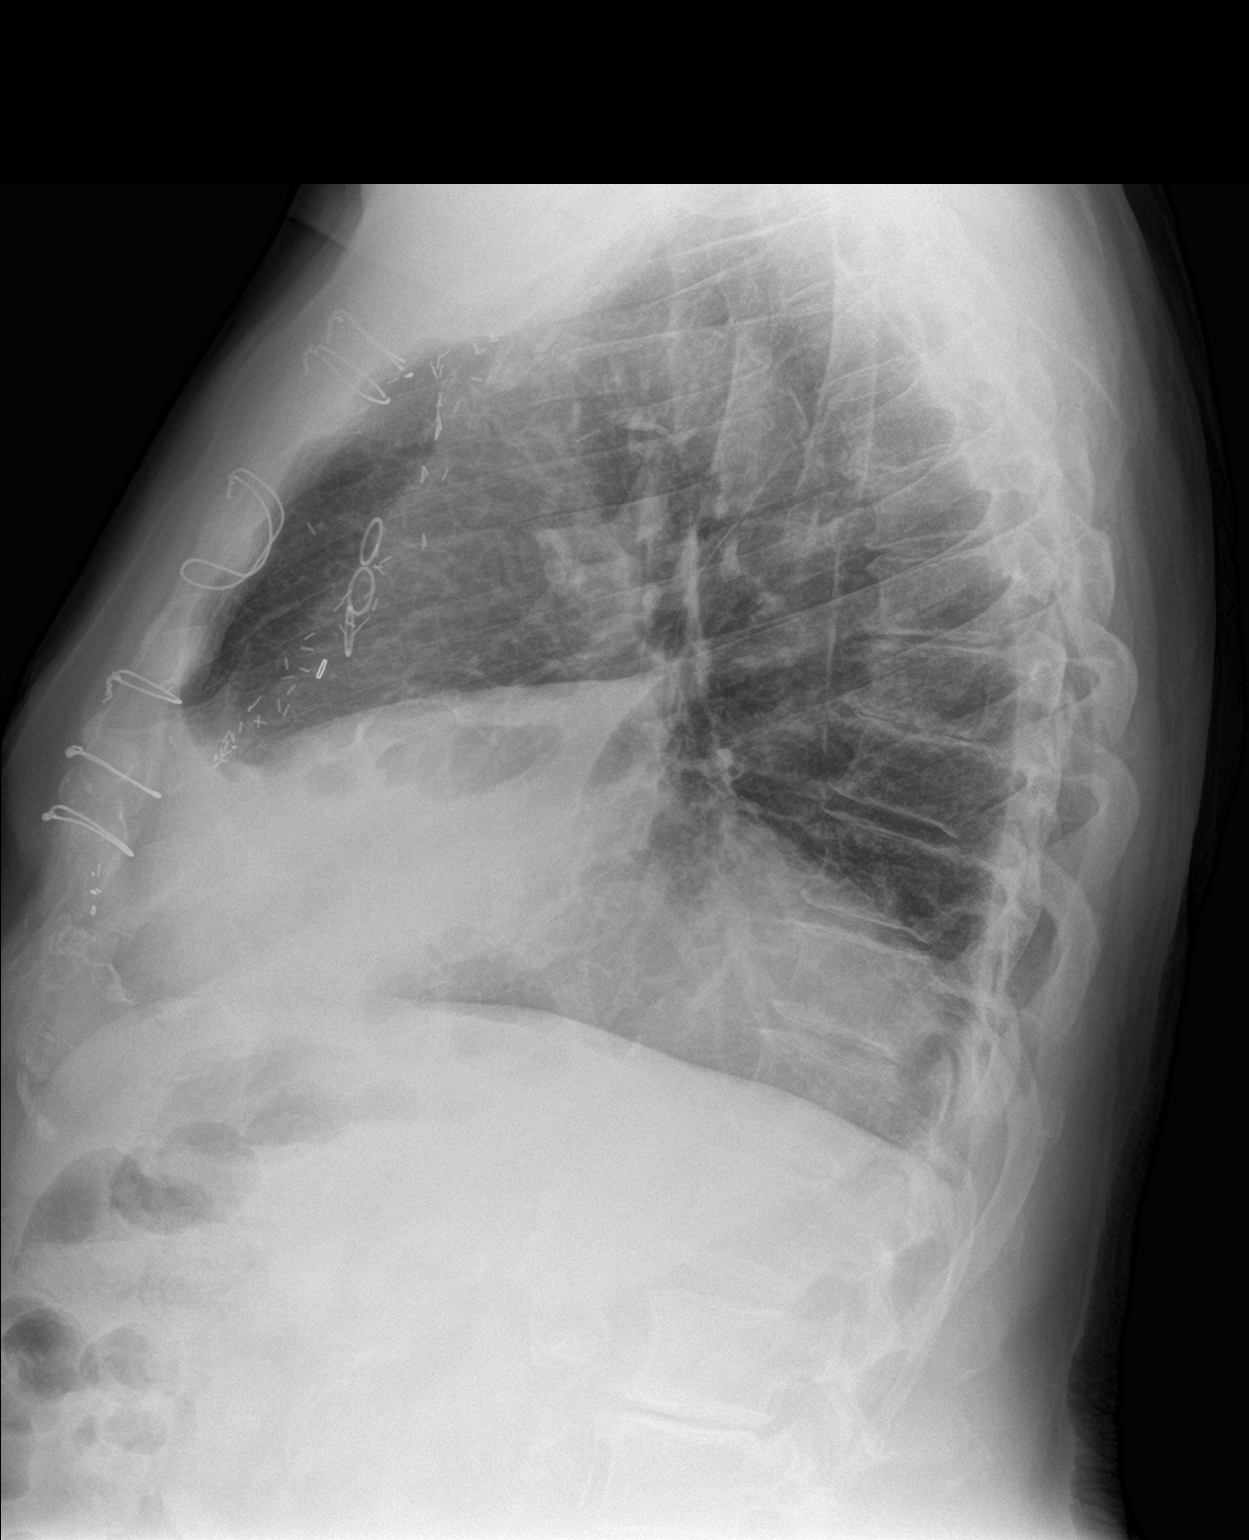

[2 of 2 positions shown; findings below may reference images not displayed]

FINDINGS: There is hazy increased density which projects in the right middle
lobe more conspicuous today. The right hemidiaphragm is mildly
elevated. Bowel loops interposed themselves between the liver and
the right hemidiaphragm. The left lung is clear. The heart and
pulmonary vascularity are normal. There are post CABG changes. The
upper 2 sternal wires are chronically broken. There is calcification
in the wall of the aortic arch. The bony thorax exhibits no acute
abnormality.
IMPRESSION: Chronic elevation of the right hemidiaphragm. New increased density
most likely in the middle lobe is worrisome for pneumonia. No CHF.
Followup PA and lateral chest X-ray is recommended in 3-4 weeks
following trial of antibiotic therapy to ensure resolution and
exclude underlying malignancy.

Thoracic aortic atherosclerosis.  Previous CABG.

## 2018-06-28 ENCOUNTER — Ambulatory Visit: Payer: Medicare HMO | Admitting: Primary Care

## 2018-06-28 ENCOUNTER — Encounter: Payer: Self-pay | Admitting: Primary Care

## 2018-06-28 VITALS — BP 100/62 | HR 78 | Temp 98.6°F | Ht 69.0 in | Wt 188.8 lb

## 2018-06-28 DIAGNOSIS — J45909 Unspecified asthma, uncomplicated: Secondary | ICD-10-CM | POA: Diagnosis not present

## 2018-06-28 DIAGNOSIS — J209 Acute bronchitis, unspecified: Secondary | ICD-10-CM

## 2018-06-28 MED ORDER — FLUTTER DEVI: 1.0000 | Freq: Three times a day (TID) | 0 refills | Status: AC

## 2018-06-28 NOTE — Assessment & Plan Note (Signed)
Blood glucose elevated >250 with prednisone 50mg  for bronchitis symptoms  Instructed to take half tab daily until complete

## 2018-06-28 NOTE — Patient Instructions (Signed)
Take 1/2 tab prednisone daily until complete Continue Biaxin as prescribed  Mucinex twice a day  Delsym cough syrup twice a day Use flutter valve three times a day   Monitor blood sugars, please call PCP or pulmonary office if >350  FU in 7 days with Eustaquio Maize, NP

## 2018-06-28 NOTE — Assessment & Plan Note (Signed)
Seen at [facility] yesterday for cough, treated with Prednisone 50mg  and Biaxin  Add flutter valve TID, mucinex BID and delsym for cough  FU in 1 week

## 2018-06-28 NOTE — Progress Notes (Signed)
@Patient  ID: Sean Lin, male    DOB: Jun 07, 1935, 83 y.o.   MRN: [id number]  Chief Complaint  Patient presents with  . Acute Visit    bad yesterday to urgent care-productive yellow cough-SOB    Referring provider: Raina Mina., MD  HPI: 82 year old male, former smoker. Hx asthma, allergic rhinitis, pneumonia in May. Patient of Dr. Lamonte Sakai. Last seen by pulmonary NP in June 2019. Maintained on Symbicort 160 and prn Albuterol.   06/28/2018 Patient presents today for acute visit with complains of cough and green/yellow sputum production. He made apt with [facility] and ended up going to UC yesterday because he was not feeling well. He was started on prednisone 50mg  x 5 days and Biaxin x 10 days. He has taken one dose of medication. BS > 250 this morning. Feels 100% better today, cough is clearing. He is eating and drinking normally today. Afebrile today in office.    Allergies  Allergen Reactions  . Codeine     nausea  . Hydrocodone     REACTION: nausea  . Levofloxacin     REACTION: nausea    Immunization History  Administered Date(s) Administered  . Influenza Split 06/26/2012, 08/02/2015, 07/04/2017  . Influenza Whole 07/03/2011  . Influenza, High Dose Seasonal PF 08/01/2013  . Influenza,inj,Quad PF,6+ Mos 08/01/2014, 07/07/2016  . Pneumococcal Conjugate-13 08/02/2015  . Pneumococcal Polysaccharide-23 11/02/2003    Past Medical History:  Diagnosis Date  . CAD (coronary artery disease)    A. 03/21/97: s/p CABG x4 (pvt) - [facility] - [facility]; SVG -DIAG; SVG-OM; SVG-RCA B. 10/04/2008: EX. MV - EX. TIME 5:15; MAX HR 155; EF 65%; LOW RISK STUDY WITH NL     . Cerebrovascular disease   . Depression   . HLD (hyperlipidemia)   . HTN (hypertension)   . Prostate cancer ([place])    hx  . [place] hemorrhagic fever    pending r tha ??    Tobacco History: Social History   Tobacco Use  Smoking Status Former Smoker  . Packs/day: 0.50  . Years: 15.00  . Pack years: 7.50  . Types:  Cigarettes  . Last attempt to quit: 11/02/1983  . Years since quitting: 34.6  Smokeless Tobacco Never Used  Tobacco Comment   quit in 1989. Smoked for 40 years, up to two packs a day    Counseling given: Not Answered Comment: quit in 1989. Smoked for 40 years, up to two packs a day    Outpatient Medications Prior to Visit  Medication Sig Dispense Refill  . albuterol (VENTOLIN HFA) 108 (90 BASE) MCG/ACT inhaler Inhale 2 puffs into the lungs 2 (two) times daily.      Marland Kitchen aspirin EC 325 MG tablet Take 325 mg by mouth daily.      Marland Kitchen atorvastatin (LIPITOR) 40 MG tablet Take 40 mg by mouth daily.    . clarithromycin (BIAXIN) 500 MG tablet Take 500 mg by mouth 2 (two) times daily.    . felodipine (PLENDIL) 10 MG 24 hr tablet Take 10 mg by mouth daily. Reported on 10/23/2015    . FLUOCINONIDE EX Apply topically. 2-3 times a day    . furosemide (LASIX) 20 MG tablet TAKE ONE TABLET EVERY DAY 30 tablet 6  . glimepiride (AMARYL) 1 MG tablet Take 1 tablet by mouth daily.  5  . losartan-hydrochlorothiazide (HYZAAR) 100-12.5 MG per tablet Take 1 tablet by mouth daily.      Marland Kitchen neomycin-bacitracin-polymyxin (NEOSPORIN) OINT Apply 1 application  topically daily.    Marland Kitchen omeprazole (PRILOSEC OTC) 20 MG tablet Take 20 mg by mouth daily.      . predniSONE (DELTASONE) 50 MG tablet Take 50 mg by mouth daily with breakfast.    . Respiratory Therapy Supplies (FLUTTER) DEVI Use as directed. 1 each 0  . Spacer/Aero-Holding Chambers (AEROCHAMBER MV) inhaler Use as instructed 1 each 0  . SYMBICORT 160-4.5 MCG/ACT inhaler INHALE 2 PUFFS BY MOUTH 2 TIMES DAILY 10.2 g 3  . traMADol (ULTRAM) 50 MG tablet Take 100 mg by mouth 3 (three) times daily as needed.      No facility-administered medications prior to visit.     Review of Systems  Review of Systems  Constitutional: Positive for chills and fever.  Respiratory: Positive for cough.     Physical Exam  BP 100/62 (BP Location: Right Arm, Cuff Size: Normal)   Pulse  78   Temp 98.6 F (37 C)   Ht 5\' 9"  (1.753 m)   Wt 188 lb 12.8 oz (85.6 kg)   SpO2 95%   BMI 27.88 kg/m    Physical Exam  Constitutional: He is oriented to person, place, and time. He appears well-developed and well-nourished. No distress.  HENT:  Head: Normocephalic and atraumatic.  Eyes: Pupils are equal, round, and reactive to light. EOM are normal.  Neck: Normal range of motion. Neck supple.  Cardiovascular: Normal rate and regular rhythm.  Pulmonary/Chest: Effort normal. No respiratory distress.  LS with scattered rhonchi   Neurological: He is alert and oriented to person, place, and time.  Skin: Skin is warm and dry.  Psychiatric: He has a normal mood and affect. His behavior is normal. Judgment and thought content normal.     Lab Results:  CBC    Component Value Date/Time   WBC 14.6 (H) 10/10/2015 0515   RBC 4.08 (L) 10/10/2015 0515   HGB 12.3 (L) 10/10/2015 0515   HCT 36.3 (L) 10/10/2015 0515   PLT 252 10/10/2015 0515   MCV 89.0 10/10/2015 0515   MCH 30.1 10/10/2015 0515   MCHC 33.9 10/10/2015 0515   RDW 12.2 10/10/2015 0515   LYMPHSABS 1.7 10/08/2015 1803   MONOABS 1.8 (H) 10/08/2015 1803   EOSABS 0.0 10/08/2015 1803   BASOSABS 0.0 10/08/2015 1803    BMET    Component Value Date/Time   NA 137 10/10/2015 0515   K 3.6 10/10/2015 0515   CL 102 10/10/2015 0515   CO2 25 10/10/2015 0515   GLUCOSE 332 (H) 10/10/2015 0515   BUN 34 (H) 10/10/2015 0515   CREATININE 1.21 10/10/2015 0515   CALCIUM 8.5 (L) 10/10/2015 0515   GFRNONAA 56 (L) 10/10/2015 0515   GFRAA >60 10/10/2015 0515    BNP No results found for: BNP  ProBNP No results found for: PROBNP  Imaging: No results found.   Assessment & Plan:   Acute bronchitis Seen at [facility] yesterday for cough, treated with Prednisone 50mg  and Biaxin  Add flutter valve TID, mucinex BID and delsym for cough  FU in 1 week   Type 2 diabetes mellitus ([place]) Blood glucose elevated >250 with prednisone 50mg  for  bronchitis symptoms  Instructed to take half tab daily until complete      Martyn Ehrich, NP 06/28/2018

## 2018-07-06 ENCOUNTER — Ambulatory Visit (INDEPENDENT_AMBULATORY_CARE_PROVIDER_SITE_OTHER)
Admission: RE | Admit: 2018-07-06 | Discharge: 2018-07-06 | Disposition: A | Discharge status: Home / Self Care | Payer: Medicare HMO | Source: Ambulatory Visit | Attending: Primary Care | Admitting: Primary Care

## 2018-07-06 ENCOUNTER — Ambulatory Visit: Payer: Medicare HMO | Admitting: Primary Care

## 2018-07-06 ENCOUNTER — Encounter: Payer: Self-pay | Admitting: Primary Care

## 2018-07-06 VITALS — BP 112/52 | HR 78 | Ht 69.0 in | Wt 186.8 lb

## 2018-07-06 DIAGNOSIS — J209 Acute bronchitis, unspecified: Secondary | ICD-10-CM

## 2018-07-06 DIAGNOSIS — J45909 Unspecified asthma, uncomplicated: Secondary | ICD-10-CM | POA: Diagnosis not present

## 2018-07-06 LAB — NITRIC OXIDE: FENO LEVEL (PPB): 15

## 2018-07-06 MED ORDER — LEVALBUTEROL HCL 0.31 MG/3ML IN NEBU: 1.0000 | INHALATION_SOLUTION | Freq: Three times a day (TID) | RESPIRATORY_TRACT | 12 refills | Status: DC | PRN

## 2018-07-06 NOTE — Assessment & Plan Note (Addendum)
-   Improved; completed abx and steriod course - Repeat CXR today - Continue flutter valve TID, cough/deep breathing exercises - Delsym OTC twice a day (sample given)

## 2018-07-06 NOTE — Assessment & Plan Note (Signed)
-   Not currently exacerbated - LS with mild diffuse exp wheeze - FENO 15 - Continues Symbicort 160 - Sending in xopenex neb treatments, advised to use twice a day for 1 week and then q8hrs prn sob/wheeze

## 2018-07-06 NOTE — Progress Notes (Signed)
@Patient  ID: Sean Lin, male    DOB: 1935-02-02, 83 y.o.   MRN: [id number]  Chief Complaint  Patient presents with  . Follow-up    better-cough with yellow mucus    Referring provider: Raina Mina., MD  HPI: 83 year old male, former smoker. Hx asthma, allergic rhinitis, pneumonia in May. Patient of Dr. Lamonte Sakai. Last seen by pulmonary NP in June 2019. Maintained on Symbicort 160 and prn Albuterol hfa.   06/28/2018 Patient presents today for acute visit with complains of cough and green/yellow sputum production. He made apt with [facility] and ended up going to UC yesterday because he was not feeling well. He was started on prednisone 50mg  x 5 days and Biaxin x 10 days. He has taken one dose of medication. BS > 250 this morning. Feels 100% better today, cough is clearing. He is eating and drinking normally today. Afebrile today in office.    07/06/2018  Patient presents today for 1 week fu visit. He was treated with Biaxin and prednisone for bronchitis symptoms. Cough has improved, he feels better than he previously did. Continues to have some production described as light yellow. Taking Symbicort as prescribed, using rescue inhaler same amt. Reporst low energy. BS was 65 this morning. Took it again after eating and reports it was 220. Saw PCP yesterday. FENO 15 today. Needs to return to office/PCP for influenza vaccine in 1-2 weeks.    Allergies  Allergen Reactions  . Codeine     nausea  . Hydrocodone     REACTION: nausea  . Levofloxacin     REACTION: nausea    Immunization History  Administered Date(s) Administered  . Influenza Split 06/26/2012, 08/02/2015, 07/04/2017  . Influenza Whole 07/03/2011  . Influenza, High Dose Seasonal PF 08/01/2013  . Influenza,inj,Quad PF,6+ Mos 08/01/2014, 07/07/2016  . Pneumococcal Conjugate-13 08/02/2015  . Pneumococcal Polysaccharide-23 11/02/2003    Past Medical History:  Diagnosis Date  . CAD (coronary artery disease)    A.  03/21/97: s/p CABG x4 (pvt) - [place] - LAD; SVG -DIAG; SVG-OM; SVG-RCA B. 10/04/2008: EX. MV - EX. TIME 5:15; MAX HR 155; EF 65%; LOW RISK STUDY WITH NL     . Cerebrovascular disease   . Depression   . HLD (hyperlipidemia)   . HTN (hypertension)   . Prostate cancer ([place])    hx  . [place] hemorrhagic fever    pending r tha ??    Tobacco History: Social History   Tobacco Use  Smoking Status Former Smoker  . Packs/day: 0.50  . Years: 15.00  . Pack years: 7.50  . Types: Cigarettes  . Last attempt to quit: 11/02/1983  . Years since quitting: 34.6  Smokeless Tobacco Never Used  Tobacco Comment   quit in 1989. Smoked for 40 years, up to two packs a day    Counseling given: Not Answered Comment: quit in 1989. Smoked for 40 years, up to two packs a day    Outpatient Medications Prior to Visit  Medication Sig Dispense Refill  . albuterol (VENTOLIN HFA) 108 (90 BASE) MCG/ACT inhaler Inhale 2 puffs into the lungs 2 (two) times daily.      Marland Kitchen aspirin EC 325 MG tablet Take 325 mg by mouth daily.      Marland Kitchen atorvastatin (LIPITOR) 40 MG tablet Take 40 mg by mouth daily.    . clarithromycin (BIAXIN) 500 MG tablet Take 500 mg by mouth 2 (two) times daily.    . felodipine (PLENDIL) 10  MG 24 hr tablet Take 10 mg by mouth daily. Reported on 10/23/2015    . FLUOCINONIDE EX Apply topically. 2-3 times a day    . furosemide (LASIX) 20 MG tablet TAKE ONE TABLET EVERY DAY 30 tablet 6  . glimepiride (AMARYL) 1 MG tablet Take 1 tablet by mouth daily.  5  . losartan-hydrochlorothiazide (HYZAAR) 100-12.5 MG per tablet Take 1 tablet by mouth daily.      Marland Kitchen neomycin-bacitracin-polymyxin (NEOSPORIN) OINT Apply 1 application topically daily.    Marland Kitchen omeprazole (PRILOSEC OTC) 20 MG tablet Take 20 mg by mouth daily.      . predniSONE (DELTASONE) 50 MG tablet Take 50 mg by mouth daily with breakfast.    . Respiratory Therapy Supplies (FLUTTER) DEVI Use as directed. 1 each 0  . Respiratory Therapy Supplies (FLUTTER) DEVI 1  Device by Does not apply route 3 (three) times daily. 1 each 0  . Spacer/Aero-Holding Chambers (AEROCHAMBER MV) inhaler Use as instructed 1 each 0  . SYMBICORT 160-4.5 MCG/ACT inhaler INHALE 2 PUFFS BY MOUTH 2 TIMES DAILY 10.2 g 3  . traMADol (ULTRAM) 50 MG tablet Take 100 mg by mouth 3 (three) times daily as needed.      No facility-administered medications prior to visit.     Review of Systems  Review of Systems  Constitutional: Positive for fatigue. Negative for activity change, appetite change, chills, diaphoresis, fever and unexpected weight change.       Low energy   HENT: Negative.   Respiratory: Positive for cough. Negative for apnea, choking, chest tightness, shortness of breath and stridor.   Cardiovascular: Negative.      Physical Exam  BP (!) 110/46 (BP Location: Left Arm, Cuff Size: Normal)   Pulse 78   Ht 5\' 9"  (1.753 m)   Wt 186 lb 12.8 oz (84.7 kg)   SpO2 92%   BMI 27.59 kg/m  Physical Exam  Constitutional: He is oriented to person, place, and time. He appears well-developed and well-nourished. No distress.  HENT:  Head: Normocephalic and atraumatic.  Neck: Normal range of motion. Neck supple.  Cardiovascular: Normal rate and regular rhythm.  Pulmonary/Chest: Effort normal. No stridor. No respiratory distress.  LS with mild exp wheeze t/o, congestion clears with cough   Musculoskeletal: Normal range of motion.  Neurological: He is alert and oriented to person, place, and time.     Lab Results:  CBC    Component Value Date/Time   WBC 14.6 (H) 10/10/2015 0515   RBC 4.08 (L) 10/10/2015 0515   HGB 12.3 (L) 10/10/2015 0515   HCT 36.3 (L) 10/10/2015 0515   PLT 252 10/10/2015 0515   MCV 89.0 10/10/2015 0515   MCH 30.1 10/10/2015 0515   MCHC 33.9 10/10/2015 0515   RDW 12.2 10/10/2015 0515   LYMPHSABS 1.7 10/08/2015 1803   MONOABS 1.8 (H) 10/08/2015 1803   EOSABS 0.0 10/08/2015 1803   BASOSABS 0.0 10/08/2015 1803    BMET    Component Value Date/Time    NA 137 10/10/2015 0515   K 3.6 10/10/2015 0515   CL 102 10/10/2015 0515   CO2 25 10/10/2015 0515   GLUCOSE 332 (H) 10/10/2015 0515   BUN 34 (H) 10/10/2015 0515   CREATININE 1.21 10/10/2015 0515   CALCIUM 8.5 (L) 10/10/2015 0515   GFRNONAA 56 (L) 10/10/2015 0515   GFRAA >60 10/10/2015 0515    BNP No results found for: BNP  ProBNP No results found for: PROBNP  Imaging: No results found.  Assessment & Plan:   Acute bronchitis - Improved; completed abx and steriod course - Repeat CXR today - Continue flutter valve TID, cough/deep breathing exercises - Delsym OTC twice a day (sample given)  Asthma, extrinsic, without status asthmaticus - Not currently exacerbated - LS with mild diffuse exp wheeze - FENO 15 - Continues Symbicort 160 - Sending in xopenex neb treatments, advised to use twice a day for 1 week and then q8hrs prn sob/wheeze       Martyn Ehrich, NP 07/06/2018

## 2018-07-06 NOTE — Patient Instructions (Addendum)
Repeat chest xray today  FENO was 15, no need for additional steroids  Continue flutter valve three time a day Deep breathing and coughing exercises Delsym cough syrup twice a day  Will send nebulizer medication to pharmacy- use as directed   Return if symptoms worsen. Monitor for fever, worsening sob, increased colored mucus production.   Get flu shot in 1-2 weeks please

## 2018-07-18 ENCOUNTER — Encounter: Payer: Self-pay | Admitting: Emergency Medicine

## 2018-07-18 ENCOUNTER — Ambulatory Visit: Payer: Medicare HMO | Admitting: Emergency Medicine

## 2018-07-18 VITALS — BP 126/68 | HR 71 | Ht 69.0 in | Wt 180.0 lb

## 2018-07-18 DIAGNOSIS — J4540 Moderate persistent asthma, uncomplicated: Secondary | ICD-10-CM | POA: Diagnosis not present

## 2018-07-18 DIAGNOSIS — R05 Cough: Secondary | ICD-10-CM

## 2018-07-18 DIAGNOSIS — Z23 Encounter for immunization: Secondary | ICD-10-CM | POA: Diagnosis not present

## 2018-07-18 DIAGNOSIS — R059 Cough, unspecified: Secondary | ICD-10-CM

## 2018-07-18 DIAGNOSIS — J309 Allergic rhinitis, unspecified: Secondary | ICD-10-CM

## 2018-07-18 NOTE — Progress Notes (Signed)
Subjective:    Patient ID: Sean Lin, male    DOB: 01-Jul-1935, 83 y.o.   MRN: [id number] HPI  ROV 10/03/17 --83 year old gentleman with a history of combined obstructive and restrictive lung disease in the setting of asthma and a paralyzed right hemidiaphragm.  He also has chronic cough with associated allergic rhinitis.  He returns today for regular follow-up. He reports that he is feeling well, is able to get out and walk. He is able to run a riding mower. He is not doing a lot of coughing currently. He is off zyrtec - causes sleepiness. Uses loratadine prn. No real wheeze. Remains on symbicort, feels that he benefits. He uses albuterol about 2x a day, usually on a schedule w the symbicort. Flu shot up to date.   ROV 07/18/18 --83 year old man with a history of restrictive and obstructive lung disease.  He has a paralyzed right hemidiaphragm as well as asthma.  He also has chronic cough with allergic rhinitis as a contributor.  He was treated for an acute exacerbation in late May 2019.  Then again in late August when he was seen at urgent care.  He describes his symptoms as "rattling", coughing with increased mucous production. He was given pred and clarithromycin. He has been managed on Symbicort. At his last visit was given xopenex, used it temporarily on a schedule. Still has it available to to use prn. He has been using delsym cough syrup. He is using flutter valve bid - unsure whether it helps him much. He is on loratadine.     Objective:   Physical Exam  Vitals:   07/18/18 1014  BP: 126/68  Pulse: 71  SpO2: 92%  Weight: 180 lb (81.6 kg)  Height: 5\' 9"  (1.753 m)     Gen: Pleasant, over wt man, in no distress,  normal affect  ENT: No lesions,  mouth clear,  oropharynx clear, no postnasal drip, No hoarseness   Neck: No JVD, no stridor  Lungs: No use of accessory muscles, distant with some exp coarse BS, no overt wheze  Cardiovascular: RRR, heart sounds normal, no murmur or  gallops, no peripheral edema  Musculoskeletal: No deformities, no cyanosis or clubbing  Neuro: alert, non focal  Skin: Warm, no lesions or rashes   Sniff Test 02/21/12:  IMPRESSION:  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 No pathologically  enlarged mediastinal, hilar or axillary lymph nodes are present.  There is stable elevation of the right hemidiaphragm with associated  right basilar atelectasis. No significant pleural or pericardial  effusion is present. The thoracic inlet appears normal.  The multifocal right lung opacities noted on the original study  remain resolved. There is a stable tiny left lower lobe nodule on  image 41. No new or enlarging pulmonary nodules are identified.  There is no confluent airspace opacity.  Images through the upper abdomen demonstrate stable mild hepatic  steatosis and a low-density 1.6 cm right renal lesion on image 59.  There are no worrisome osseous findings.      Assessment & Plan:     Asthma, extrinsic, without status asthmaticus Treated for recent exacerbation, bronchitis.  He appears to be back to baseline.  Unclear whether this was true bronchospasm versus just a flare of his chronic cough-there is a lot of overlap in his case.  Continue Symbicort  2 puffs twice a day.  Remember to rinse and gargle after using. Keep albuterol available to use 2 puffs up to every 4 hours as needed for shortness of breath, wheezing, chest tightness. You can also keep Xopenex nebulizer available to use up to every 6 hours if needed for shortness of breath.  Do not need to take this medication on a schedule. Flu shot today. Follow with Dr Lamonte Sakai in 4 months or sooner if you have any problems.  Allergic rhinitis We will try restarting fluticasone nasal spray, 2 sprays each nostril  once daily Continue loratadine 10 mg once daily. Continue to keep Delsym cough syrup available to use as needed for cough suppression. Continue to use your flutter valve as needed to help clear your chest secretions  Cough We will try restarting fluticasone nasal spray, 2 sprays each nostril once daily Continue loratadine 10 mg once daily. Continue to keep Delsym cough syrup available to use as needed for cough suppression. Continue to use your flutter valve as needed to help clear your chest secretions   Baltazar Apo, MD, PhD 07/18/2018, 10:47 AM [facility] [phone] or if no answer [phone]

## 2018-07-18 NOTE — Assessment & Plan Note (Signed)
We will try restarting fluticasone nasal spray, 2 sprays each nostril once daily Continue loratadine 10 mg once daily. Continue to keep Delsym cough syrup available to use as needed for cough suppression. Continue to use your flutter valve as needed to help clear your chest secretions

## 2018-07-18 NOTE — Patient Instructions (Signed)
We will try restarting fluticasone nasal spray, 2 sprays each nostril once daily Continue loratadine 10 mg once daily. Continue to keep Delsym cough syrup available to use as needed for cough suppression. Continue to use your flutter valve as needed to help clear your chest secretions Continue Symbicort 2 puffs twice a day.  Remember to rinse and gargle after using. Keep albuterol available to use 2 puffs up to every 4 hours as needed for shortness of breath, wheezing, chest tightness. You can also keep Xopenex nebulizer available to use up to every 6 hours if needed for shortness of breath.  Do not need to take this medication on a schedule. Flu shot today. Follow with Dr Lamonte Sakai in 4 months or sooner if you have any problems.

## 2018-07-18 NOTE — Assessment & Plan Note (Signed)
Treated for recent exacerbation, bronchitis.  He appears to be back to baseline.  Unclear whether this was true bronchospasm versus just a flare of his chronic cough-there is a lot of overlap in his case.  Continue Symbicort 2 puffs twice a day.  Remember to rinse and gargle after using. Keep albuterol available to use 2 puffs up to every 4 hours as needed for shortness of breath, wheezing, chest tightness. You can also keep Xopenex nebulizer available to use up to every 6 hours if needed for shortness of breath.  Do not need to take this medication on a schedule. Flu shot today. Follow with Dr Lamonte Sakai in 4 months or sooner if you have any problems.

## 2018-08-15 ENCOUNTER — Telehealth: Payer: Self-pay | Admitting: Emergency Medicine

## 2018-08-15 MED ORDER — BUDESONIDE-FORMOTEROL FUMARATE 160-4.5 MCG/ACT IN AERO: INHALATION_SPRAY | RESPIRATORY_TRACT | 3 refills | Status: DC

## 2018-08-15 NOTE — Telephone Encounter (Signed)
Called and spoke with Alyssa from [facility] advised that this medication was sent through the computer. Nothing further needed.

## 2019-01-02 DIAGNOSIS — J9601 Acute respiratory failure with hypoxia: Secondary | ICD-10-CM | POA: Diagnosis not present

## 2019-01-02 DIAGNOSIS — J690 Pneumonitis due to inhalation of food and vomit: Secondary | ICD-10-CM

## 2019-01-02 DIAGNOSIS — J441 Chronic obstructive pulmonary disease with (acute) exacerbation: Secondary | ICD-10-CM | POA: Diagnosis not present

## 2019-01-02 DIAGNOSIS — N179 Acute kidney failure, unspecified: Secondary | ICD-10-CM

## 2019-01-02 DIAGNOSIS — J440 Chronic obstructive pulmonary disease with acute lower respiratory infection: Secondary | ICD-10-CM

## 2019-01-02 DIAGNOSIS — A419 Sepsis, unspecified organism: Secondary | ICD-10-CM

## 2019-01-03 DIAGNOSIS — A419 Sepsis, unspecified organism: Secondary | ICD-10-CM | POA: Diagnosis not present

## 2019-01-03 DIAGNOSIS — J9601 Acute respiratory failure with hypoxia: Secondary | ICD-10-CM | POA: Diagnosis not present

## 2019-01-03 DIAGNOSIS — J441 Chronic obstructive pulmonary disease with (acute) exacerbation: Secondary | ICD-10-CM | POA: Diagnosis not present

## 2019-01-03 DIAGNOSIS — J690 Pneumonitis due to inhalation of food and vomit: Secondary | ICD-10-CM | POA: Diagnosis not present

## 2019-01-04 DIAGNOSIS — J9601 Acute respiratory failure with hypoxia: Secondary | ICD-10-CM | POA: Diagnosis not present

## 2019-01-04 DIAGNOSIS — A419 Sepsis, unspecified organism: Secondary | ICD-10-CM | POA: Diagnosis not present

## 2019-01-04 DIAGNOSIS — J441 Chronic obstructive pulmonary disease with (acute) exacerbation: Secondary | ICD-10-CM | POA: Diagnosis not present

## 2019-01-04 DIAGNOSIS — J690 Pneumonitis due to inhalation of food and vomit: Secondary | ICD-10-CM | POA: Diagnosis not present

## 2019-02-08 ENCOUNTER — Other Ambulatory Visit: Payer: Self-pay | Admitting: Emergency Medicine

## 2019-02-08 ENCOUNTER — Telehealth: Payer: Self-pay | Admitting: Emergency Medicine

## 2019-02-08 MED ORDER — BUDESONIDE-FORMOTEROL FUMARATE 160-4.5 MCG/ACT IN AERO: INHALATION_SPRAY | RESPIRATORY_TRACT | 1 refills | Status: DC

## 2019-02-08 NOTE — Telephone Encounter (Signed)
Returned phone call to patient, confirmed medication and pharmacy. Refill sent. Voiced understanding. Nothing further is needed at this time.

## 2019-07-18 ENCOUNTER — Other Ambulatory Visit: Payer: Self-pay

## 2019-07-18 ENCOUNTER — Ambulatory Visit (INDEPENDENT_AMBULATORY_CARE_PROVIDER_SITE_OTHER): Payer: Medicare HMO | Admitting: Emergency Medicine

## 2019-07-18 ENCOUNTER — Encounter: Payer: Self-pay | Admitting: Emergency Medicine

## 2019-07-18 DIAGNOSIS — J4540 Moderate persistent asthma, uncomplicated: Secondary | ICD-10-CM

## 2019-07-18 NOTE — Patient Instructions (Signed)
Please continue Symbicort 2 puffs twice daily.  Rinse and gargle after using. Keep your albuterol available to use 2 puffs if needed for shortness of breath, chest tightness, wheezing. Continue to do your salt-mist inhalation, followed by your flutter valve as you have been doing Continue loratadine 10 mg once daily during the allergy season (fall and spring) Continue Prilosec as you have been taking it. Your pneumonia shot is up-to-date You will need the high-dose flu shot this fall Follow with Dr. Lamonte Sakai in 12 months or sooner if you have any problems.

## 2019-07-18 NOTE — Progress Notes (Signed)
Subjective:    Patient ID: Sean Lin, male    DOB: 11-Oct-1935, 84 y.o.   MRN: [id number] HPI   ROV 07/18/2019 -- Follow-up visit for 84 year old gentleman with history of mixed restriction and obstruction.  He has a paralyzed right hemidiaphragm and clinical asthma..  He also deals with allergic rhinitis and chronic cough.  His last exacerbation of his obstructive lung disease was in March 2020. Was admitted to [facility] with fever, increased cough and phlegm - suspected CAP. He is improved now. F/u CXR 05/2019 reports available > no infiltrates.  Remains on Symbicort, Has not needed any albuterol. He is doing himalaya salt inhalations, then uses flutter. Seems to be helping his breathing, secretions.  He is back on loratadine, takes during allergy season. Remains on prilosec. PNA shot up to date. Denies dysphagia. Has been able to exert, work in the yard.     Objective:   Physical Exam  Vitals:   07/18/19 1056  BP: 130/66  Pulse: 74  SpO2: 95%  Weight: 186 lb (84.4 kg)  Height: 5\' 10"  (1.778 m)     Gen: Pleasant, over wt man, in no distress,  normal affect  ENT: No lesions,  mouth clear,  oropharynx clear, no postnasal drip, No hoarseness   Neck: No JVD, no stridor  Lungs: No use of accessory muscles, distant with some bronchial R BS, no wheeze.   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 No pathologically  enlarged mediastinal, hilar or axillary lymph nodes are present.  There is stable elevation of the right hemidiaphragm with associated  right  basilar atelectasis. No significant pleural or pericardial  effusion is present. The thoracic inlet appears normal.  The multifocal right lung opacities noted on the original study  remain resolved. There is a stable tiny left lower lobe nodule on  image 41. No new or enlarging pulmonary nodules are identified.  There is no confluent airspace opacity.  Images through the upper abdomen demonstrate stable mild hepatic  steatosis and a low-density 1.6 cm right renal lesion on image 59.  There are no worrisome osseous findings.      Assessment & Plan:     Asthma, extrinsic, without status asthmaticus He had an acute exacerbation, possible pneumonia in March, hospitalized and treated.  He is now back to baseline in fact feels quite well.  He denies aspiration but question whether he may have silent aspiration and puts him at risk for recurrent pneumonias.  Could consider dysphasia work-up at some point going forward.  Stable on his current regimen.  He has been using [place] salt inhalation device followed by flutter valve for secretion management and clearance.  He feels that this is helping his breathing.  I do not see any potential negative effects, it would be similar to using a saline neb.  He can continue this.  Please continue Symbicort 2 puffs twice daily.  Rinse and gargle after using. Keep your albuterol available to use 2 puffs if needed for shortness of breath, chest tightness, wheezing. Continue to do your salt-mist inhalation, followed by your flutter valve  as you have been doing Continue loratadine 10 mg once daily during the allergy season (fall and spring) Continue Prilosec as you have been taking it. Your pneumonia shot is up-to-date You will need the high-dose flu shot this fall Follow with Dr. Lamonte Sakai in 12 months or sooner if you have any problems.   Baltazar Apo, MD, PhD 07/18/2019, 11:27 AM [facility] [phone] or if no answer [phone]

## 2019-07-18 NOTE — Assessment & Plan Note (Signed)
He had an acute exacerbation, possible pneumonia in March, hospitalized and treated.  He is now back to baseline in fact feels quite well.  He denies aspiration but question whether he may have silent aspiration and puts him at risk for recurrent pneumonias.  Could consider dysphasia work-up at some point going forward.  Stable on his current regimen.  He has been using [place] salt inhalation device followed by flutter valve for secretion management and clearance.  He feels that this is helping his breathing.  I do not see any potential negative effects, it would be similar to using a saline neb.  He can continue this.  Please continue Symbicort 2 puffs twice daily.  Rinse and gargle after using. Keep your albuterol available to use 2 puffs if needed for shortness of breath, chest tightness, wheezing. Continue to do your salt-mist inhalation, followed by your flutter valve as you have been doing Continue loratadine 10 mg once daily during the allergy season (fall and spring) Continue Prilosec as you have been taking it. Your pneumonia shot is up-to-date You will need the high-dose flu shot this fall Follow with Dr. Lamonte Sakai in 12 months or sooner if you have any problems.

## 2019-11-29 ENCOUNTER — Telehealth: Payer: Self-pay | Admitting: Emergency Medicine

## 2019-11-29 NOTE — Telephone Encounter (Signed)
Called and spoke to pt's wife, Sean Lin. She states she bought a spacer for the pt but it has the mask attached and the pt doesn't like that, it cannot be removed. Advised Sean Lin to see if they pharmacy can order one the pt prefers and if not to go to a medical supply store. Sean Lin knows of a local medical supply store she can go to, advised her to call us back if they need a script for the spacer. Sean Lin verbalized understanding and denied any further questions or concerns at this time.

## 2019-12-01 ENCOUNTER — Other Ambulatory Visit: Payer: Self-pay | Admitting: Emergency Medicine

## 2020-07-01 ENCOUNTER — Encounter: Payer: Self-pay | Admitting: Emergency Medicine

## 2020-07-01 ENCOUNTER — Ambulatory Visit: Payer: Medicare HMO | Admitting: Emergency Medicine

## 2020-07-01 ENCOUNTER — Other Ambulatory Visit: Payer: Self-pay

## 2020-07-01 DIAGNOSIS — J4540 Moderate persistent asthma, uncomplicated: Secondary | ICD-10-CM

## 2020-07-01 DIAGNOSIS — J309 Allergic rhinitis, unspecified: Secondary | ICD-10-CM | POA: Diagnosis not present

## 2020-07-01 NOTE — Progress Notes (Signed)
°  Subjective:    Patient ID: Sean Lin, male    DOB: 1935-06-05, 85 y.o.   MRN: [id number] HPI   ROV 07/18/2019 -- Follow-up visit for 85 year old gentleman with history of mixed restriction and obstruction.  He has a paralyzed right hemidiaphragm and clinical asthma..  He also deals with allergic rhinitis and chronic cough.  His last exacerbation of his obstructive lung disease was in March 2020. Was admitted to [facility] with fever, increased cough and phlegm - suspected CAP. He is improved now. F/u CXR 05/2019 reports available > no infiltrates.  Remains on Symbicort, Has not needed any albuterol. He is doing himalaya salt inhalations, then uses flutter. Seems to be helping his breathing, secretions.  He is back on loratadine, takes during allergy season. Remains on prilosec. PNA shot up to date. Denies dysphagia. Has been able to exert, work in the yard.   ROV 07/01/20 --85 year old gentleman with mixed restriction and obstruction due to paralyzed right hemidiaphragm and asthma.  He has allergic rhinitis and chronic cough.  He has been maintained on Symbicort.  Has albuterol but never needs it.Marland Kitchen He is able to exert some, denies any SOB. Able to mow on a riding mower, runs the trimmer. No exacerbations or hospitalizations since 12/2018. He is having back and shoulder arthritic pain. He is still doing inhaled saline with good secretion clearance.  COVID vaccine up to date.     Objective:   Physical Exam  Vitals:   07/01/20 1602  BP: 110/68  Pulse: 66  Temp: 98 F (36.7 C)  TempSrc: Temporal  SpO2: 96%  Weight: 177 lb 9.6 oz (80.6 kg)  Height: 5\' 10"  (1.778 m)     Gen: Pleasant, over wt man, in no distress,  normal affect  ENT: No lesions,  mouth clear,  oropharynx clear, no postnasal drip, No hoarseness   Neck: No JVD, no stridor  Lungs: No use of accessory muscles, distant with some bronchial R BS, no wheeze.   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  He is compliant with his Symbicort, very rarely if ever needs albuterol.  He uses inhaled saline for secretion clearance which has been beneficial.  Plan to continue the same regimen.  His Covid vaccine is up-to-date and they are going to get the booster when appropriate for his age group.  No changes to his regimen today.  Allergic rhinitis He is off loratadine.  He is using [place] salt inhalation and flutter valve for secretion clearance.  Stable.  Plan to continue same regimen.   Baltazar Apo, MD, PhD 07/01/2020, 4:16 PM [facility] [phone] or if no answer [phone]

## 2020-07-01 NOTE — Patient Instructions (Signed)
Continue Symbicort 2 puffs twice a day.  Rinse and gargle after using. Keep your albuterol available to use if you have shortness of breath, chest tightness, wheezing. Continue your inhaled saline treatments to help with mucus clearance as you have been doing them. Get the COVID-19 vaccine booster when it becomes available for your age group. Follow with Dr. Lamonte Sakai in 12 months or sooner if you have any problems.

## 2020-07-01 NOTE — Assessment & Plan Note (Signed)
He is off loratadine.  He is using [place] salt inhalation and flutter valve for secretion clearance.  Stable.  Plan to continue same regimen.

## 2020-07-01 NOTE — Assessment & Plan Note (Signed)
Has been very stable ever since hospitalization in March 2020.  He is compliant with his Symbicort, very rarely if ever needs albuterol.  He uses inhaled saline for secretion clearance which has been beneficial.  Plan to continue the same regimen.  His Covid vaccine is up-to-date and they are going to get the booster when appropriate for his age group.  No changes to his regimen today.

## 2020-07-09 ENCOUNTER — Other Ambulatory Visit: Payer: Self-pay | Admitting: Emergency Medicine

## 2020-08-14 ENCOUNTER — Ambulatory Visit: Payer: Medicare HMO | Admitting: Emergency Medicine

## 2020-09-12 ENCOUNTER — Encounter: Payer: Self-pay | Admitting: Adult Health

## 2020-09-12 ENCOUNTER — Other Ambulatory Visit: Payer: Self-pay

## 2020-09-12 ENCOUNTER — Ambulatory Visit (INDEPENDENT_AMBULATORY_CARE_PROVIDER_SITE_OTHER): Payer: Medicare HMO

## 2020-09-12 ENCOUNTER — Ambulatory Visit: Payer: Medicare HMO | Admitting: Adult Health

## 2020-09-12 VITALS — BP 120/50 | HR 79 | Temp 97.4°F | Ht 70.0 in | Wt 178.0 lb

## 2020-09-12 DIAGNOSIS — J4540 Moderate persistent asthma, uncomplicated: Secondary | ICD-10-CM | POA: Diagnosis not present

## 2020-09-12 DIAGNOSIS — J3089 Other allergic rhinitis: Secondary | ICD-10-CM | POA: Diagnosis not present

## 2020-09-12 DIAGNOSIS — R059 Cough, unspecified: Secondary | ICD-10-CM

## 2020-09-12 NOTE — Assessment & Plan Note (Signed)
Recent exacerbation now resolving. Patient is to continue on trigger prevention.  Chest x-ray shows no acute process.  Plan  Patient Instructions  Continue on Symbicort 2 puffs Twice daily  , rinse after use .  Albuterol Inhaler As needed   Liquid Mucinex DM  Twice daily  As needed  Congestion  Use Flutter valve Twice daily   Saline nasal rinses and gel As needed   Follow up Dr. Lamonte Sakai  As planned and As needed   Please contact office for sooner follow up if symptoms do not improve or worsen or seek emergency care

## 2020-09-12 NOTE — Assessment & Plan Note (Signed)
Continue on current regimen  Plan  Patient Instructions  Continue on Symbicort 2 puffs Twice daily  , rinse after use .  Albuterol Inhaler As needed   Liquid Mucinex DM  Twice daily  As needed  Congestion  Use Flutter valve Twice daily   Saline nasal rinses and gel As needed   Follow up Dr. Lamonte Sakai  As planned and As needed   Please contact office for sooner follow up if symptoms do not improve or worsen or seek emergency care

## 2020-09-12 NOTE — Progress Notes (Signed)
@Patient  ID: Sean Lin, male    DOB: 1935-03-27, 85 y.o.   MRN: [id number]  Chief Complaint  Patient presents with  . Follow-up    Asthma     Referring provider: Raina Mina., MD  HPI: 85 year old male with minimum smoking history followed for asthma with mixed restriction and airflow obstruction.  Also has a paralyzed right hemodiaphragm.  TEST/EVENTS :  PFTs December 2012 showed FEV1 at 55% ratio 67, FVC 54%, positive bronchodilator response, significant mid flow reversibility, DLCO 87%.  09/12/2020 Follow up : Asthma  Patient presents for a follow-up for asthma.  Says over the last 4 weeks had episodes of increased cough congestion and some intermittent wheezing.  Was seen by primary care provider and given antibiotics and steroids.  Symptoms are improving but continues to have some lingering cough with white to clear mucus.  Some drainage in the back of his throat.  Decreased wheezing.  Breathing has returned back to baseline.  Says he feels pretty good.  Chest x-ray today shows clear lungs.  Chronic right hemodiaphragm elevation.  No acute process was noted. Remains on Symbicort twice daily.  No increased albuterol use.    Allergies  Allergen Reactions  . Codeine     nausea  . Hydrocodone     REACTION: nausea  . Levofloxacin     REACTION: nausea    Immunization History  Administered Date(s) Administered  . Fluad Quad(high Dose 65+) 09/08/2020  . Influenza Split 06/26/2012, 08/02/2015, 07/04/2017  . Influenza Whole 07/03/2011  . Influenza, High Dose Seasonal PF 08/01/2013, 07/18/2018  . Influenza,inj,Quad PF,6+ Mos 08/01/2014, 09/04/2015, 07/07/2016, 07/25/2017  . Influenza-Unspecified 09/04/2015, 08/13/2019  . PFIZER SARS-COV-2 Vaccination 11/17/2019, 12/08/2019  . Pneumococcal Conjugate-13 08/02/2015, 04/20/2017  . Pneumococcal Polysaccharide-23 11/02/2003, 11/01/2004  . Zoster 11/01/2006    Past Medical History:  Diagnosis Date  . CAD (coronary artery  disease)    A. 03/21/97: s/p CABG x4 (pvt) - [place] - LAD; SVG -DIAG; SVG-OM; SVG-RCA B. 10/04/2008: EX. MV - EX. TIME 5:15; MAX HR 155; EF 65%; LOW RISK STUDY WITH NL     . Cerebrovascular disease   . Depression   . HLD (hyperlipidemia)   . HTN (hypertension)   . Prostate cancer ([place])    hx  . [place] hemorrhagic fever    pending r tha ??    Tobacco History: Social History   Tobacco Use  Smoking Status Former Smoker  . Packs/day: 0.50  . Years: 15.00  . Pack years: 7.50  . Types: Cigarettes  . Quit date: 11/02/1983  . Years since quitting: 36.8  Smokeless Tobacco Never Used  Tobacco Comment   quit in 1989. Smoked for 40 years, up to two packs a day    Counseling given: Not Answered Comment: quit in 1989. Smoked for 40 years, up to two packs a day    Outpatient Medications Prior to Visit  Medication Sig Dispense Refill  . albuterol (VENTOLIN HFA) 108 (90 BASE) MCG/ACT inhaler Inhale 2 puffs into the lungs 2 (two) times daily.      Marland Kitchen aspirin EC 325 MG tablet Take 325 mg by mouth daily.      Marland Kitchen atorvastatin (LIPITOR) 40 MG tablet Take 40 mg by mouth daily.    . budesonide-formoterol (SYMBICORT) 160-4.5 MCG/ACT inhaler INHALE 2 PUFFS BY MOUTH 2 TIMES DAILY 30.6 g 10  . felodipine (PLENDIL) 10 MG 24 hr tablet Take 10 mg by mouth daily. Reported on 10/23/2015    .  FLUOCINONIDE EX Apply topically. 2-3 times a day    . furosemide (LASIX) 20 MG tablet TAKE ONE TABLET EVERY DAY 30 tablet 6  . glimepiride (AMARYL) 1 MG tablet Take 1 tablet by mouth daily.  5  . losartan-hydrochlorothiazide (HYZAAR) 100-12.5 MG per tablet Take 1 tablet by mouth daily.      Marland Kitchen neomycin-bacitracin-polymyxin (NEOSPORIN) OINT Apply 1 application topically daily.    Marland Kitchen omeprazole (PRILOSEC OTC) 20 MG tablet Take 20 mg by mouth daily.      Marland Kitchen Respiratory Therapy Supplies (FLUTTER) DEVI 1 Device by Does not apply route 3 (three) times daily. 1 each 0  . Spacer/Aero-Holding Chambers (AEROCHAMBER MV) inhaler Use as  instructed 1 each 0  . traMADol (ULTRAM) 50 MG tablet Take 100 mg by mouth 3 (three) times daily as needed.      No facility-administered medications prior to visit.     Review of Systems:   Constitutional:   No  weight loss, night sweats,  Fevers, chills, fatigue, or  lassitude.  HEENT:   No headaches,  Difficulty swallowing,  Tooth/dental problems, or  Sore throat,                No sneezing, itching, ear ache,  +nasal congestion, post nasal drip,   CV:  No chest pain,  Orthopnea, PND, swelling in lower extremities, anasarca, dizziness, palpitations, syncope.   GI  No heartburn, indigestion, abdominal pain, nausea, vomiting, diarrhea, change in bowel habits, loss of appetite, bloody stools.   Resp:    No chest wall deformity  Skin: no rash or lesions.  GU: no dysuria, change in color of urine, no urgency or frequency.  No flank pain, no hematuria   MS:  No joint pain or swelling.  No decreased range of motion.  No back pain.    Physical Exam  BP (!) 120/50 (BP Location: Left Arm, Patient Position: Sitting, Cuff Size: Normal)   Pulse 79   Temp (!) 97.4 F (36.3 C) (Temporal)   Ht 5\' 10"  (1.778 m)   Wt 178 lb (80.7 kg)   SpO2 90%   BMI 25.54 kg/m   GEN: A/Ox3; pleasant , NAD, well nourished    HEENT:  [place]/AT,   , NOSE-clear, THROAT-clear, no lesions, no postnasal drip or exudate noted.   NECK:  Supple w/ fair ROM; no JVD; normal carotid impulses w/o bruits; no thyromegaly or nodules palpated; no lymphadenopathy.    RESP  Clear  P & A; w/o, wheezes/ rales/ or rhonchi. no accessory muscle use, no dullness to percussion  CARD:  RRR, no m/r/g, no peripheral edema, pulses intact, no cyanosis or clubbing.  GI:   Soft & nt; nml bowel sounds; no organomegaly or masses detected.   Musco: Warm bil, no deformities or joint swelling noted.   Neuro: alert, no focal deficits noted.    Skin: Warm, no lesions or rashes    Lab Results:  BNP No results found for:  BNP  ProBNP No results found for: PROBNP  Imaging: DG Chest 2 View  Result Date: 09/12/2020 CLINICAL DATA:  Asthma and cough. EXAM: CHEST - 2 VIEW COMPARISON:  07/06/2018 FINDINGS: Low volume chest with chronic asymmetric right diaphragm elevation. Normal heart size and mediastinal contours. There has been CABG. There is no edema, consolidation, effusion, or pneumothorax. Diffuse disc degeneration. IMPRESSION: No acute finding. Electronically Signed   By: Monte Fantasia M.D.   On: 09/12/2020 10:29      No flowsheet data found.  No results found for: NITRICOXIDE      Assessment & Plan:   Asthma, extrinsic, without status asthmaticus Recent exacerbation now resolving. Patient is to continue on trigger prevention.  Chest x-ray shows no acute process.  Plan  Patient Instructions  Continue on Symbicort 2 puffs Twice daily  , rinse after use .  Albuterol Inhaler As needed   Liquid Mucinex DM  Twice daily  As needed  Congestion  Use Flutter valve Twice daily   Saline nasal rinses and gel As needed   Follow up Dr. Lamonte Sakai  As planned and As needed   Please contact office for sooner follow up if symptoms do not improve or worsen or seek emergency care       Allergic rhinitis Continue on current regimen  Plan  Patient Instructions  Continue on Symbicort 2 puffs Twice daily  , rinse after use .  Albuterol Inhaler As needed   Liquid Mucinex DM  Twice daily  As needed  Congestion  Use Flutter valve Twice daily   Saline nasal rinses and gel As needed   Follow up Dr. Lamonte Sakai  As planned and As needed   Please contact office for sooner follow up if symptoms do not improve or worsen or seek emergency care          Rexene Edison, NP 09/12/2020

## 2020-09-12 NOTE — Patient Instructions (Addendum)
Continue on Symbicort 2 puffs Twice daily  , rinse after use .  Albuterol Inhaler As needed   Liquid Mucinex DM  Twice daily  As needed  Congestion  Use Flutter valve Twice daily   Saline nasal rinses and gel As needed   Follow up Dr. Lamonte Sakai  As planned and As needed   Please contact office for sooner follow up if symptoms do not improve or worsen or seek emergency care

## 2020-09-26 ENCOUNTER — Telehealth: Payer: Self-pay | Admitting: Emergency Medicine

## 2020-09-26 NOTE — Telephone Encounter (Signed)
Called and spoke with pts wife and she stated that the pt is not any better. He has used 2 bottles of the liquid mucinex and he is still having the nasal congestion.  Per TP last note on 09/12/2020:    Plan  Patient Instructions  Continue on Symbicort 2 puffs Twice daily  , rinse after use .  Albuterol Inhaler As needed   Liquid Mucinex DM  Twice daily  As needed  Congestion  Use Flutter valve Twice daily   Saline nasal rinses and gel As needed   Follow up Dr. Lamonte Sakai  As planned and As needed   Please contact office for sooner follow up if symptoms do not improve or worsen or seek emergency care    pts wife stated that he has not been using any other medications other than what he was told to use.  pts wife wanted to know what they can do over the weekend.  Please advise. Thanks

## 2020-09-26 NOTE — Telephone Encounter (Signed)
Called and spoke with pts wife and she is aware of BW recs.  She will try these meds OTC and see how he does.  Nothing further is needed.

## 2020-09-26 NOTE — Telephone Encounter (Signed)
Looks like PCP recently treated him with abx and steriods. I would add flonase nasal spray once daily, Saline (ocean nasal spray twice daily), can try over the counter coricidin

## 2020-10-09 ENCOUNTER — Encounter: Payer: Self-pay | Admitting: Gastroenterology

## 2020-11-16 IMAGING — DX DG CHEST 2V
2 series · 2 of 2 positions shown · non-contrast
Comparison: 07/06/2018

CLINICAL DATA: Asthma and cough.

EXAM:
CHEST - 2 VIEW

[chest pa]
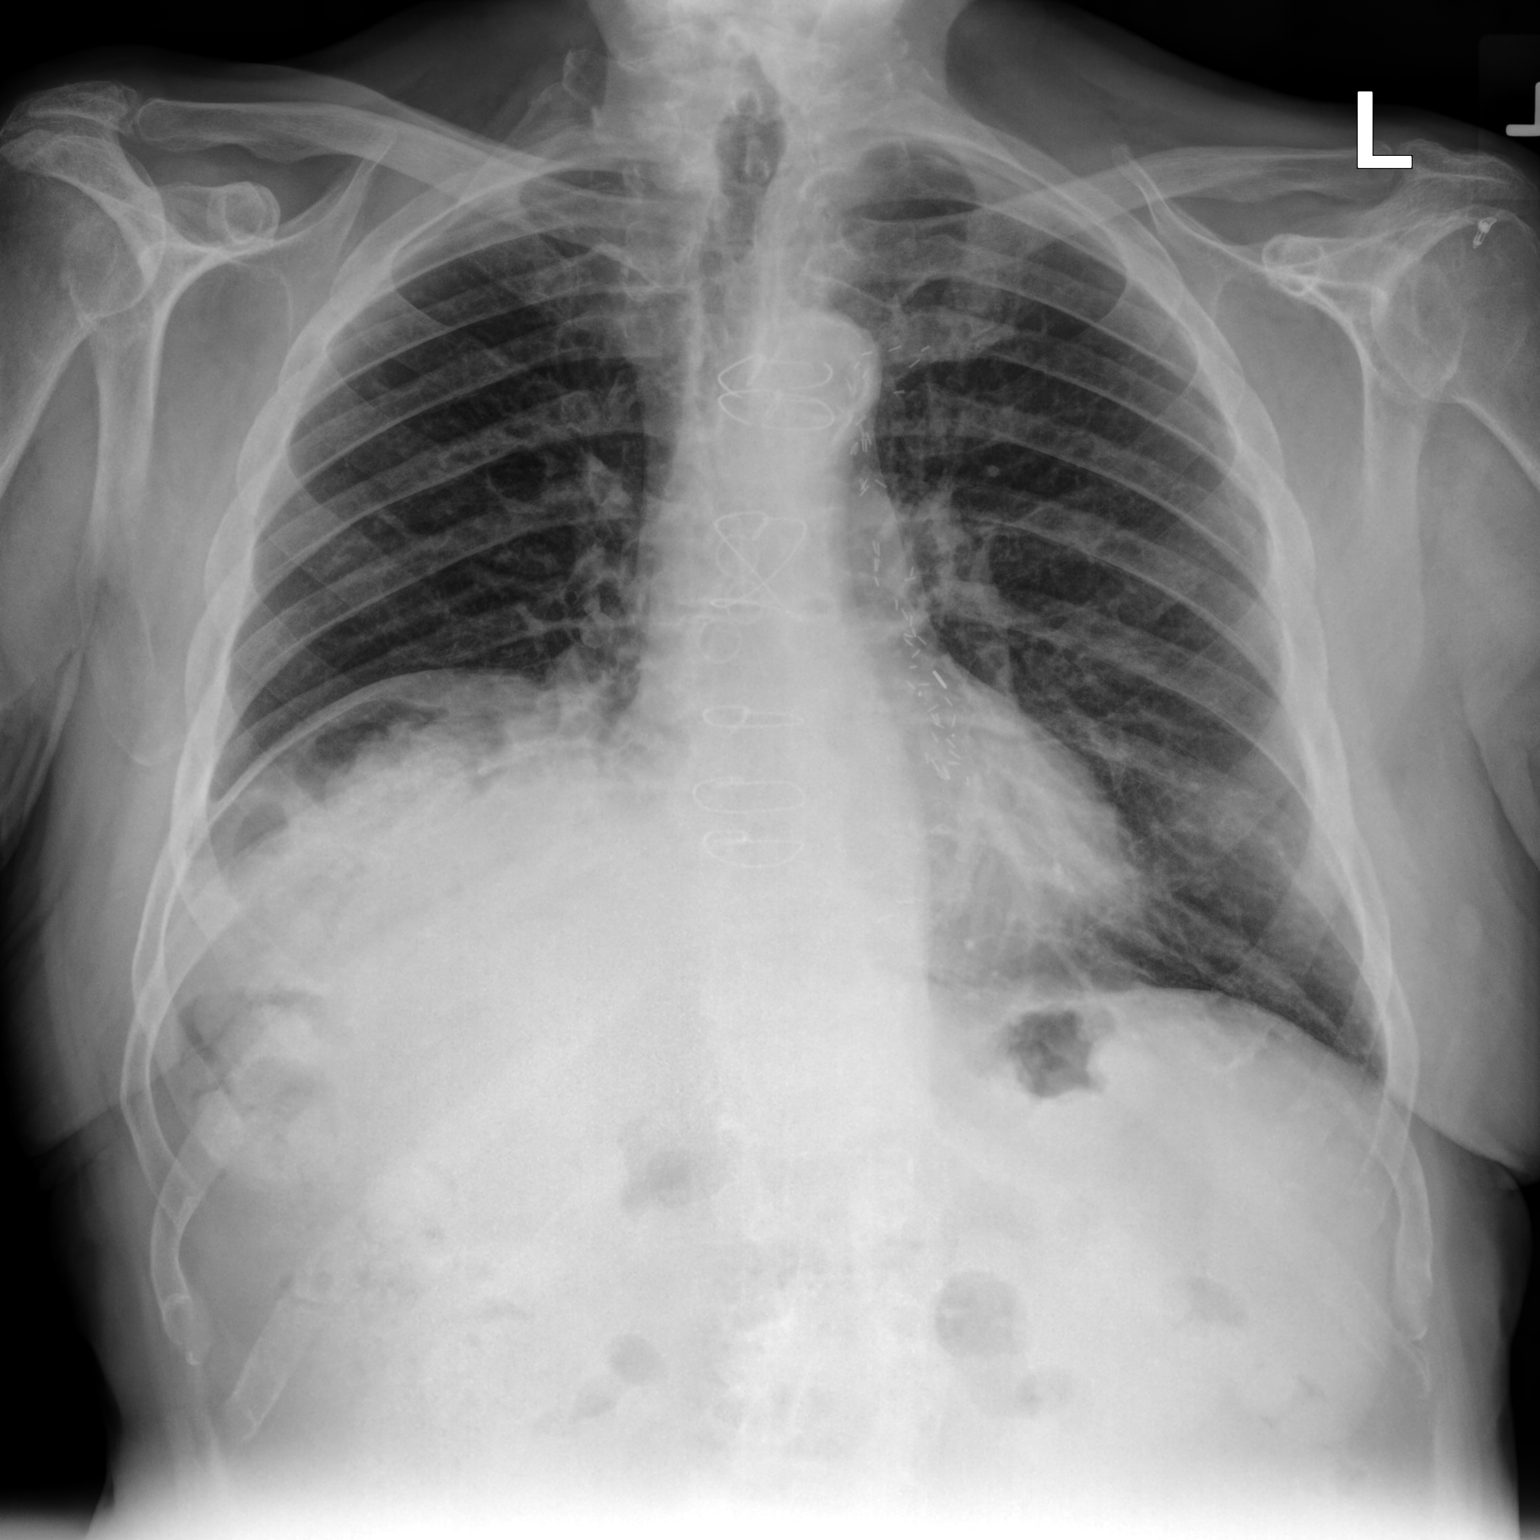

[chest lat]
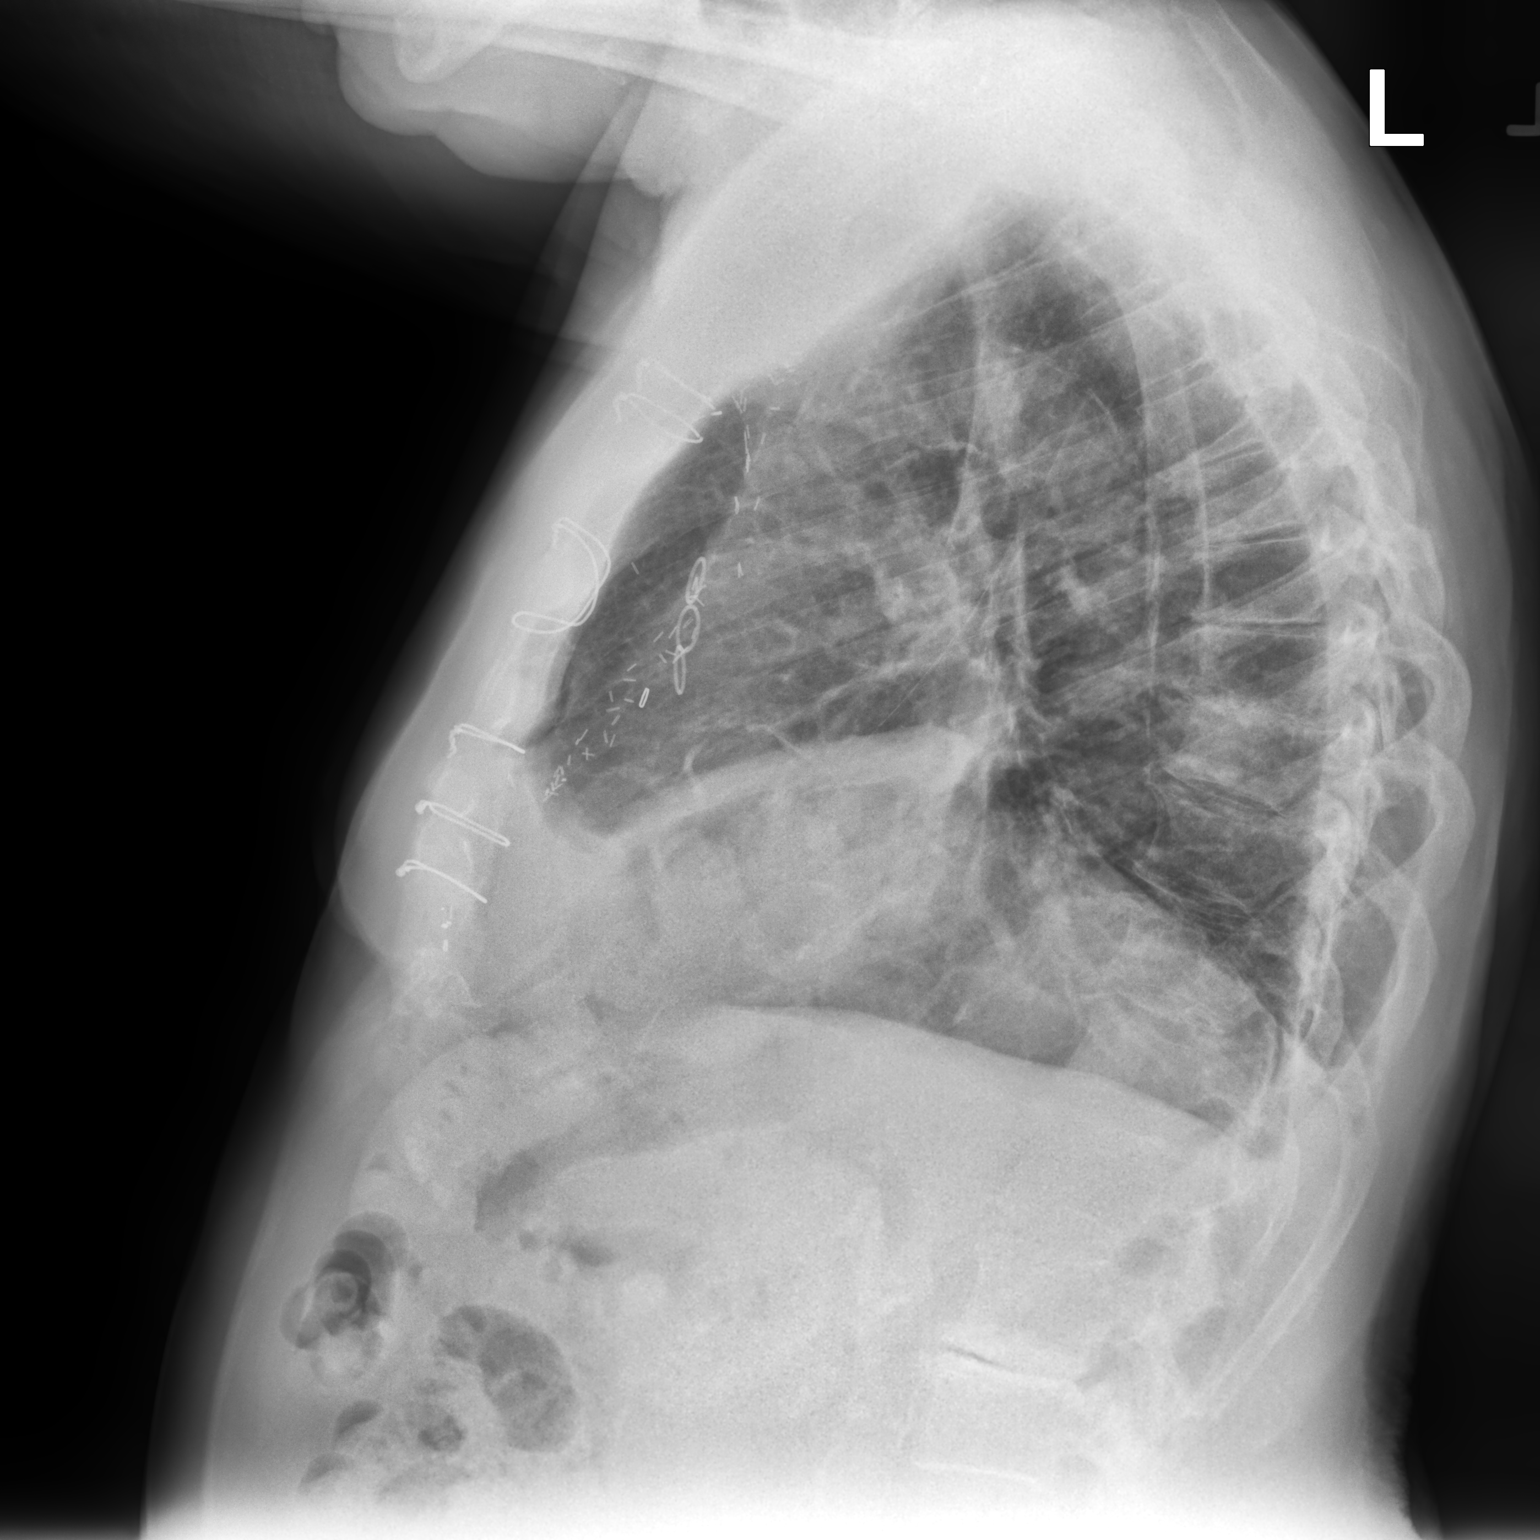

[2 of 2 positions shown; findings below may reference images not displayed]

FINDINGS: Low volume chest with chronic asymmetric right diaphragm elevation.
Normal heart size and mediastinal contours. There has been CABG.
There is no edema, consolidation, effusion, or pneumothorax. Diffuse
disc degeneration.
IMPRESSION: No acute finding.

## 2020-11-17 ENCOUNTER — Ambulatory Visit: Payer: Medicare HMO | Admitting: Gastroenterology

## 2020-12-10 ENCOUNTER — Encounter: Payer: Self-pay | Admitting: Gastroenterology

## 2020-12-10 ENCOUNTER — Ambulatory Visit: Payer: Medicare HMO | Admitting: Gastroenterology

## 2020-12-10 VITALS — BP 98/60 | HR 69 | Ht 70.0 in | Wt 158.5 lb

## 2020-12-10 DIAGNOSIS — R634 Abnormal weight loss: Secondary | ICD-10-CM

## 2020-12-10 DIAGNOSIS — K5900 Constipation, unspecified: Secondary | ICD-10-CM

## 2020-12-10 DIAGNOSIS — R110 Nausea: Secondary | ICD-10-CM | POA: Diagnosis not present

## 2020-12-10 MED ORDER — PANTOPRAZOLE SODIUM 40 MG PO TBEC: 40.0000 mg | DELAYED_RELEASE_TABLET | Freq: Every day | ORAL | 3 refills | Status: AC

## 2020-12-10 NOTE — Progress Notes (Signed)
Chief Complaint:   Referring Provider:  Raina Mina., MD      ASSESSMENT AND PLAN;   #1. Wt loss with early satiety. Neg [place] 12/05/2020  #2. Nausea  #3. constipation  Plan: -Protonix 40mg  QD #30, 11 refills -EGD after cardio cleraence ASAP. Pt's wife will make appt. -Miralax 17g po qd -Continue yoghurt and ensure along with rest of the food.  Small but frequent meals. -Check Wt every week and record. If continued wt loss, will proceed with CT abdo/pelvis with PO/IV contrast.  -FU in 12 weeks -Cannot handle colon at this time.  Discussed with patient and patient's son.   HPI:    Sean Lin is a 86 y.o. male  CAD s/p CABG, HTN, DM2 with CKD3, OA, anemia of chronic disease, vitamin B deficiency Who is having progressive memory problems ?  Progressive dementia  With intermittent dry heaves, N/V, early satiety, heartburn and weight loss.  He has been sent for semiurgent GI evaluation by means of EGD.  According to patient's wife he can tolerate yogurt and Ensure  He denies having any significant abdominal pain.  He does get constipated and has been more constipated lately.  No melena or hematochezia.  He denies having any diarrhea. No fever chills or night sweats.  Of note that the history is from patient's wife.  She feels that he is to week to undergo colonoscopy.  I do agree.  I do not think he would be able to tolerate preparation at the present time.  Wt Readings from Last 3 Encounters:  12/19/20 158 lb (71.7 kg)  12/16/20 159 lb 9.6 oz (72.4 kg)  12/10/20 158 lb 8 oz (71.9 kg)     Colon yrs ago- DR Misenhemier- neg, >70yrs ago  Past Medical History:  Diagnosis Date  . CAD (coronary artery disease)    A. 03/21/97: s/p CABG x4 (pvt) - [place] - LAD; SVG -DIAG; SVG-OM; SVG-RCA B. 10/04/2008: EX. MV - EX. TIME 5:15; MAX HR 155; EF 65%; LOW RISK STUDY WITH NL     . Cerebrovascular disease   . Depression   . HLD (hyperlipidemia)   . HTN (hypertension)   .  Prostate cancer ([place])    hx  . [place] hemorrhagic fever    pending r tha ??    Past Surgical History:  Procedure Laterality Date  . CORONARY ARTERY BYPASS GRAFT  1990 or 1991  . PROSTATE SURGERY  2004  . Right Hip Replacement    . ROTATOR CUFF REPAIR--left side      Family History  Problem Relation Age of Onset  . Arthritis Mother   . Kidney cancer Father   . Lymphoma Son        Non-Hodgkin's    Social History   Tobacco Use  . Smoking status: Former Smoker    Packs/day: 0.50    Years: 15.00    Pack years: 7.50    Types: Cigarettes    Quit date: 11/02/1983    Years since quitting: 37.1  . Smokeless tobacco: Never Used  . Tobacco comment: quit in 1989. Smoked for 40 years, up to two packs a day   Substance Use Topics  . Alcohol use: No  . Drug use: No    Current Outpatient Medications  Medication Sig Dispense Refill  . albuterol (VENTOLIN HFA) 108 (90 Base) MCG/ACT inhaler Inhale 2 puffs into the lungs 2 (two) times daily.    Marland Kitchen aspirin EC 325 MG tablet Take  325 mg by mouth daily.    Marland Kitchen atorvastatin (LIPITOR) 40 MG tablet Take 40 mg by mouth daily.    . budesonide-formoterol (SYMBICORT) 160-4.5 MCG/ACT inhaler INHALE 2 PUFFS BY MOUTH 2 TIMES DAILY 30.6 g 10  . felodipine (PLENDIL) 10 MG 24 hr tablet Take 10 mg by mouth daily. Reported on 10/23/2015    . FLUOCINONIDE EX Apply topically. 2-3 times a day    . furosemide (LASIX) 20 MG tablet TAKE ONE TABLET EVERY DAY 30 tablet 6  . glimepiride (AMARYL) 1 MG tablet Take 1 tablet by mouth daily.  5  . losartan-hydrochlorothiazide (HYZAAR) 100-12.5 MG per tablet Take 1 tablet by mouth daily.    Marland Kitchen neomycin-bacitracin-polymyxin (NEOSPORIN) OINT Apply 1 application topically daily.    Marland Kitchen omeprazole (PRILOSEC OTC) 20 MG tablet Take 20 mg by mouth daily.    Marland Kitchen Respiratory Therapy Supplies (FLUTTER) DEVI 1 Device by Does not apply route 3 (three) times daily. 1 each 0  . Spacer/Aero-Holding Chambers (AEROCHAMBER MV) inhaler Use as  instructed 1 each 0  . traMADol (ULTRAM) 50 MG tablet Take 100 mg by mouth 3 (three) times daily as needed.     No current facility-administered medications for this visit.    Allergies  Allergen Reactions  . Codeine     nausea  . Hydrocodone     REACTION: nausea  . Levofloxacin     REACTION: nausea    Review of Systems:  Constitutional: Denies fever, chills, diaphoresis, appetite change and fatigue.  HEENT: Denies photophobia, eye pain, redness, hearing loss, ear pain, congestion, sore throat, rhinorrhea, sneezing, mouth sores, neck pain, neck stiffness and tinnitus.   Respiratory: Denies SOB, DOE, cough, chest tightness,  and wheezing.   Cardiovascular: Denies chest pain, palpitations and leg swelling.  Genitourinary: Denies dysuria, urgency, frequency, hematuria, flank pain and difficulty urinating.  Musculoskeletal: Denies myalgias, back pain, joint swelling, arthralgias and gait problem.  Skin: No rash.  Neurological: Denies dizziness, seizures, syncope, weakness, light-headedness, numbness and headaches.  Hematological: Denies adenopathy. Easy bruising, personal or family bleeding history  Psychiatric/Behavioral: No anxiety or depression     Physical Exam:    BP 98/60 (BP Location: Right Arm, Patient Position: Sitting, Cuff Size: Normal)   Pulse 69   Ht 5\' 10"  (1.778 m)   Wt 158 lb 8 oz (71.9 kg)   BMI 22.74 kg/m  Wt Readings from Last 3 Encounters:  12/10/20 158 lb 8 oz (71.9 kg)  09/12/20 178 lb (80.7 kg)  07/01/20 177 lb 9.6 oz (80.6 kg)   Constitutional:  Well-developed, in no acute distress. Psychiatric: Normal mood and affect. Behavior is normal. HEENT: Pupils normal.  Conjunctivae are normal. No scleral icterus. Neck supple.  Cardiovascular: Normal rate, regular rhythm. No edema Pulmonary/chest: Effort normal and breath sounds normal. No wheezing, rales or rhonchi. Abdominal: Soft, nondistended. Nontender. Bowel sounds active throughout. There are no  masses palpable. No hepatomegaly. Rectal: Deferred Neurological: Alert but gets forgetful. Skin: Skin is warm and dry. No rashes noted.  Data Reviewed: I have personally reviewed following labs and imaging studies  CBC: CBC Latest Ref Rng & Units 10/10/2015 10/09/2015 10/08/2015  WBC 4.0 - 10.5 K/uL 14.6(H) 12.6(H) 17.1(H)  Hemoglobin 13.0 - 17.0 g/dL 12.3(L) 12.4(L) 13.8  Hematocrit 39.0 - 52.0 % 36.3(L) 38.4(L) 41.7  Platelets 150 - 400 K/uL 252 212 245    CMP: CMP Latest Ref Rng & Units 10/10/2015 10/09/2015 10/08/2015  Glucose 65 - 99 mg/dL 332(H) 226(H) 159(H)  BUN  6 - 20 mg/dL 34(H) 21(H) 21(H)  Creatinine 0.61 - 1.24 mg/dL 1.21 1.19 1.38(H)  Sodium 135 - 145 mmol/L 137 135 138  Potassium 3.5 - 5.1 mmol/L 3.6 4.0 4.0  Chloride 101 - 111 mmol/L 102 101 99(L)  CO2 22 - 32 mmol/L 25 25 28   Calcium 8.9 - 10.3 mg/dL 8.5(L) 8.1(L) 9.1  Total Protein 6.5 - 8.1 g/dL - - 7.8  Total Bilirubin 0.3 - 1.2 mg/dL - - 1.3(H)  Alkaline Phos 38 - 126 U/L - - 80  AST 15 - 41 U/L - - 23  ALT 17 - 63 U/L - - 19      Carmell Austria, MD 12/10/2020, 9:15 AM  Cc: Raina Mina., MD

## 2020-12-10 NOTE — Patient Instructions (Addendum)
If you are age 84 or older, your body mass index should be between 23-30. Your Body mass index is 22.74 kg/m. If this is out of the aforementioned range listed, please consider follow up with your Primary Care Provider.  If you are age 39 or younger, your body mass index should be between 19-25. Your Body mass index is 22.74 kg/m. If this is out of the aformentioned range listed, please consider follow up with your Primary Care Provider.   We have sent the following medications to your pharmacy for you to pick up at your convenience: Protonix 40mg  take daily Stop Prilosec please   Please purchase the following medications over the counter and take as directed: Miralax 17g daily  Please make an appointment with Cardiologist please as soon as you can.   Follow up in 12 weeks to see about proceeding with EGD. We will see about cardiac clearance  Thank you,  Dr. Jackquline Denmark

## 2020-12-16 ENCOUNTER — Encounter: Payer: Self-pay | Admitting: Internal Medicine

## 2020-12-16 ENCOUNTER — Other Ambulatory Visit: Payer: Self-pay

## 2020-12-16 ENCOUNTER — Ambulatory Visit: Payer: Medicare HMO | Admitting: Internal Medicine

## 2020-12-16 VITALS — BP 90/42 | HR 67 | Ht 70.0 in | Wt 159.6 lb

## 2020-12-16 DIAGNOSIS — E7849 Other hyperlipidemia: Secondary | ICD-10-CM | POA: Diagnosis not present

## 2020-12-16 DIAGNOSIS — I700 Atherosclerosis of aorta: Secondary | ICD-10-CM | POA: Diagnosis not present

## 2020-12-16 DIAGNOSIS — E119 Type 2 diabetes mellitus without complications: Secondary | ICD-10-CM | POA: Diagnosis not present

## 2020-12-16 DIAGNOSIS — I10 Essential (primary) hypertension: Secondary | ICD-10-CM

## 2020-12-16 DIAGNOSIS — I2510 Atherosclerotic heart disease of native coronary artery without angina pectoris: Secondary | ICD-10-CM | POA: Diagnosis not present

## 2020-12-16 MED ORDER — ASPIRIN EC 81 MG PO TBEC: 81.0000 mg | DELAYED_RELEASE_TABLET | Freq: Every day | ORAL | 11 refills | Status: AC

## 2020-12-16 NOTE — Progress Notes (Signed)
Cardiology Office Note:    Date:  12/16/2020   ID:  Sean Lin, DOB [birth year], MRN [id number]  PCP:  Raina Mina., MD   [place]  Cardiologist:  No primary care provider on file.  Saw Dr. Stanford Breed up until 2012 Advanced Practice Provider:  No care team member to display Electrophysiologist:  None       CC: Nausea Consulted for the evaluation of CAD at the behest of Raina Mina., MD   History of Present Illness:    Sean Lin is a 86 y.o. male with a hx of CAD s/p CABG (LIMA to LAD, SVG to Diag, SVG to OM, SVG to [facility]), DM with HTN, HLD with DM who presents for new evaluation 12/16/20.  Patient notes that he is doing fine.  Since 2012- has had no issues until new nausea and vomiting.  Saw PCP and GI:  Needs risk stratified for endoscopy.  Has been having hiccups and dry heaves.  Through this has had spontaneousl episodes of dry heaving and needs to spit.    Notes that he had chest pain in 1998.  Has Bypass and no issues and that's why he was lost to follow up.  No chest pain or pressure .  No SOB/DOE and no PND/Orthopnea.  No weight gain or leg swelling.  No palpitations or syncope.  Just feels like he can't keep his air down.  Has noted weight loss with this.  Was able to mow the lawn this summer without issues.    Past Medical History:  Diagnosis Date  . CAD (coronary artery disease)    A. 03/21/97: s/p CABG x4 (pvt) - LIMA - LAD; SVG -DIAG; SVG-OM; SVG-RCA B. 10/04/2008: EX. MV - EX. TIME 5:15; MAX HR 155; EF 65%; LOW RISK STUDY WITH NL     . Cerebrovascular disease   . Depression   . HLD (hyperlipidemia)   . HTN (hypertension)   . Prostate cancer ([place])    hx  . [place] hemorrhagic fever    pending r tha ??    Past Surgical History:  Procedure Laterality Date  . CORONARY ARTERY BYPASS GRAFT  1990 or 1991  . PROSTATE SURGERY  2004  . Right Hip Replacement    . ROTATOR CUFF REPAIR--left side      Current Medications: Current  Meds  Medication Sig  . albuterol (VENTOLIN HFA) 108 (90 Base) MCG/ACT inhaler Inhale 2 puffs into the lungs 2 (two) times daily.  Marland Kitchen aspirin EC 81 MG tablet Take 1 tablet (81 mg total) by mouth daily. Swallow whole.  Marland Kitchen atorvastatin (LIPITOR) 40 MG tablet Take 40 mg by mouth daily.  . budesonide-formoterol (SYMBICORT) 160-4.5 MCG/ACT inhaler INHALE 2 PUFFS BY MOUTH 2 TIMES DAILY  . cephALEXin (KEFLEX) 500 MG capsule Take by mouth.  . felodipine (PLENDIL) 10 MG 24 hr tablet Take 10 mg by mouth daily. Reported on 10/23/2015  . ferrous sulfate 325 (65 FE) MG EC tablet Take by mouth.  Marland Kitchen FLUOCINONIDE EX Apply topically. 2-3 times a day  . glimepiride (AMARYL) 1 MG tablet Take 1 tablet by mouth daily.  Marland Kitchen losartan-hydrochlorothiazide (HYZAAR) 100-12.5 MG per tablet Take 1 tablet by mouth daily.  Marland Kitchen neomycin-bacitracin-polymyxin (NEOSPORIN) OINT Apply 1 application topically daily.  Marland Kitchen omeprazole (PRILOSEC OTC) 20 MG tablet Take 20 mg by mouth daily.  . pantoprazole (PROTONIX) 40 MG tablet Take 1 tablet (40 mg total) by mouth daily.  Marland Kitchen Respiratory Therapy Supplies (  FLUTTER) DEVI 1 Device by Does not apply route 3 (three) times daily.  Marland Kitchen Spacer/Aero-Holding Chambers (AEROCHAMBER MV) inhaler Use as instructed  . traMADol (ULTRAM) 50 MG tablet Take 100 mg by mouth 3 (three) times daily as needed.  . [DISCONTINUED] aspirin EC 325 MG tablet Take 325 mg by mouth daily.     Allergies:   Codeine, Hydrocodone, and Levofloxacin   Social History   Socioeconomic History  . Marital status: Married    Spouse name: Not on file  . Number of children: Not on file  . Years of education: Not on file  . Highest education level: Not on file  Occupational History  . Occupation: RETIRED [employer] WORKER  Tobacco Use  . Smoking status: Former Smoker    Packs/day: 0.50    Years: 15.00    Pack years: 7.50    Types: Cigarettes    Quit date: 11/02/1983    Years since quitting: 37.1  . Smokeless tobacco: Never  Used  . Tobacco comment: quit in 1989. Smoked for 40 years, up to two packs a day   Substance and Sexual Activity  . Alcohol use: No  . Drug use: No  . Sexual activity: Not on file  Other Topics Concern  . Not on file  Social History Narrative  . Not on file   Social Determinants of Health   Financial Resource Strain: Not on file  Food Insecurity: Not on file  Transportation Needs: Not on file  Physical Activity: Not on file  Stress: Not on file  Social Connections: Not on file     Family History: The patient's family history includes Arthritis in his mother; Kidney cancer in his father; Lymphoma in his son.  ROS:   Please see the history of present illness.     All other systems reviewed and are negative.  EKGs/Labs/Other Studies Reviewed:    The following studies were reviewed today:   EKG:   12/16/20: Sinus rhythm rate 67 with nonspecific TWI  Transthoracic Echocardiogram: Date: 06/08/2011 Results: Study Conclusions   - Left ventricle: The cavity size was normal. Wall thickness was  increased in a pattern of mild LVH. Systolic function was normal.  The estimated ejection fraction was in the range of 60% to 65%.  Wall motion was normal; there were no regional wall motion  abnormalities. Doppler parameters are consistent with abnormal  left ventricular relaxation (grade 1 diastolic dysfunction).  - Mitral valve: Mildly calcified annulus.  - Left atrium: The atrium was mildly dilated.  - Pulmonary arteries: Systolic pressure was mildly increased. PA  peak pressure: 36mm Hg (S).    NonCardiac CT: Date:09/17/2016 Personally reviewed results: there are significant calcifications in the native and bypassed coronary arteries; aortic atherosclerosis noted   Recent Labs: No results found for requested labs within last 8760 hours.  Recent Lipid Panel    Component Value Date/Time   CHOL 142 07/26/2011 0928   TRIG 134.0 07/26/2011 0928   HDL 38.00 (L)  07/26/2011 0928   CHOLHDL 4 07/26/2011 0928   VLDL 26.8 07/26/2011 0928   LDLCALC 77 07/26/2011 0928     Risk Assessment/Calculations:    N/A  Physical Exam:    VS:  BP (!) 90/42   Pulse 67   Ht 5\' 10"  (1.778 m)   Wt 159 lb 9.6 oz (72.4 kg)   SpO2 94%   BMI 22.90 kg/m     Wt Readings from Last 3 Encounters:  12/16/20 159 lb 9.6 oz (  72.4 kg)  12/10/20 158 lb 8 oz (71.9 kg)  09/12/20 178 lb (80.7 kg)     GEN:  Well nourished, well developed in no acute distress HEENT: Bilateral Frank's Sign NECK: No JVD; No carotid bruits LYMPHATICS: No lymphadenopathy CARDIAC: RRR, no murmurs, rubs, gallops RESPIRATORY:  Clear to auscultation without rales, wheezing or rhonchi  ABDOMEN: Soft, non-tender, non-distended MUSCULOSKELETAL:  No edema; No deformity  SKIN: Warm and dry NEUROLOGIC:  Alert and oriented x 3; right hand resting tremor noted PSYCHIATRIC:  Normal affect   ASSESSMENT:    1. Coronary artery disease involving native coronary artery of native heart without angina pectoris   2. Other hyperlipidemia   3. Aortic atherosclerosis ([place])   4. Diabetes mellitus with coincident hypertension (HCC)    PLAN:    In order of problems listed above:  Preoperative Risk Assessment - The Revised Cardiac Risk Index = 1 CAD 0.9%: low risk of perioperative myocardial infarction, pulmonary edema, ventricular fibrillation, cardiac arrest, or complete heart block.  - DASI score of 10.7 associated with 4 functional mets - No further cardiac testing is recommended prior to surgery.  - The patient may proceed to surgery at acceptable risk.   - Our service is available as needed in the peri-operative period.    Coronary Artery Disease; Obstructive Aortic Atherosclerosis HLD with DM COPD - asymptomatic  - transition to to ASA 81 mg - continue statin 40 mg, goal LDL < 70 - defer BB  Diabetes with hypertension - ambulatory blood pressure < 120/80, will continue ambulatory BP  monitoring; gave education on how to perform ambulatory blood pressure monitoring including the frequency and technique; goal ambulatory blood pressure < 135/85 on average - continue home medications; agree with DC of lasix   Six monthes follow up unless new symptoms or abnormal test results warranting change in plan     Medication Adjustments/Labs and Tests Ordered: Current medicines are reviewed at length with the patient today.  Concerns regarding medicines are outlined above.  Orders Placed This Encounter  Procedures  . EKG 12-Lead   Meds ordered this encounter  Medications  . aspirin EC 81 MG tablet    Sig: Take 1 tablet (81 mg total) by mouth daily. Swallow whole.    Dispense:  30 tablet    Refill:  11    Patient Instructions  Medication Instructions:  Your physician has recommended you make the following change in your medication:  DECREASE: Aspirin to  81mg  daily *If you need a refill on your cardiac medications before your next appointment, please call your pharmacy*   Lab Work: NONE If you have labs (blood work) drawn today and your tests are completely normal, you will receive your results only by: Marland Kitchen MyChart Message (if you have MyChart) OR . A paper copy in the mail If you have any lab test that is abnormal or we need to change your treatment, we will call you to review the results.   Testing/Procedures: NONE   Follow-Up: At [facility], you and your health needs are our priority.  As part of our continuing mission to provide you with exceptional heart care, we have created designated Provider Care Teams.  These Care Teams include your primary Cardiologist (physician) and Advanced Practice Providers (APPs -  Physician Assistants and Nurse Practitioners) who all work together to provide you with the care you need, when you need it.  We recommend signing up for the patient portal called "MyChart".  Sign up information  is provided on this After Visit Summary.   MyChart is used to connect with patients for Virtual Visits (Telemedicine).  Patients are able to view lab/test results, encounter notes, upcoming appointments, etc.  Non-urgent messages can be sent to your provider as well.   To learn more about what you can do with MyChart, go to [email or web address].    Your next appointment:   6 month(s)  The format for your next appointment:   In Person  Provider:   You may see Gasper Sells, MD or one of the following Advanced Practice Providers on your designated Care Team:    Melina Copa, PA-C  Ermalinda Barrios, PA-C          Signed, Werner Lean, MD  12/16/2020 12:38 PM    [place]

## 2020-12-16 NOTE — Patient Instructions (Signed)
Medication Instructions:  Your physician has recommended you make the following change in your medication:  DECREASE: Aspirin to  81mg  daily *If you need a refill on your cardiac medications before your next appointment, please call your pharmacy*   Lab Work: NONE If you have labs (blood work) drawn today and your tests are completely normal, you will receive your results only by: Marland Kitchen MyChart Message (if you have MyChart) OR . A paper copy in the mail If you have any lab test that is abnormal or we need to change your treatment, we will call you to review the results.   Testing/Procedures: NONE   Follow-Up: At [facility], you and your health needs are our priority.  As part of our continuing mission to provide you with exceptional heart care, we have created designated Provider Care Teams.  These Care Teams include your primary Cardiologist (physician) and Advanced Practice Providers (APPs -  Physician Assistants and Nurse Practitioners) who all work together to provide you with the care you need, when you need it.  We recommend signing up for the patient portal called "MyChart".  Sign up information is provided on this After Visit Summary.  MyChart is used to connect with patients for Virtual Visits (Telemedicine).  Patients are able to view lab/test results, encounter notes, upcoming appointments, etc.  Non-urgent messages can be sent to your provider as well.   To learn more about what you can do with MyChart, go to [email or web address].    Your next appointment:   6 month(s)  The format for your next appointment:   In Person  Provider:   You may see Gasper Sells, MD or one of the following Advanced Practice Providers on your designated Care Team:    Melina Copa, PA-C  Ermalinda Barrios, PA-C

## 2020-12-18 ENCOUNTER — Encounter: Payer: Self-pay | Admitting: Gastroenterology

## 2020-12-18 ENCOUNTER — Other Ambulatory Visit: Payer: Self-pay | Admitting: Gastroenterology

## 2020-12-18 ENCOUNTER — Telehealth: Payer: Self-pay

## 2020-12-18 DIAGNOSIS — R109 Unspecified abdominal pain: Secondary | ICD-10-CM

## 2020-12-18 NOTE — Telephone Encounter (Signed)
Spoke to patient's wife this morning to inform her of Dr Steve Rattler recommendations for an expedited EGD for 12/19/20. He has been cleared by cardiology, Osvaldo Angst reviewed chart and agrees with cordiologist. Patient is scheduled for EGD in the [facility] on 12/19/20 at 4 pm. Procedure instructions discussed in detail with the wife who voiced understanding.

## 2020-12-19 ENCOUNTER — Other Ambulatory Visit: Payer: Self-pay

## 2020-12-19 ENCOUNTER — Ambulatory Visit (AMBULATORY_SURGERY_CENTER): Payer: Medicare HMO | Admitting: Gastroenterology

## 2020-12-19 ENCOUNTER — Encounter: Payer: Self-pay | Admitting: Gastroenterology

## 2020-12-19 VITALS — BP 132/59 | HR 77 | Temp 98.2°F | Resp 11 | Ht 70.0 in | Wt 158.0 lb

## 2020-12-19 DIAGNOSIS — K319 Disease of stomach and duodenum, unspecified: Secondary | ICD-10-CM

## 2020-12-19 DIAGNOSIS — K2970 Gastritis, unspecified, without bleeding: Secondary | ICD-10-CM

## 2020-12-19 DIAGNOSIS — K3189 Other diseases of stomach and duodenum: Secondary | ICD-10-CM | POA: Diagnosis not present

## 2020-12-19 DIAGNOSIS — R634 Abnormal weight loss: Secondary | ICD-10-CM

## 2020-12-19 DIAGNOSIS — K2950 Unspecified chronic gastritis without bleeding: Secondary | ICD-10-CM | POA: Diagnosis not present

## 2020-12-19 MED ORDER — SODIUM CHLORIDE 0.9 % IV SOLN: 500.0000 mL | Freq: Once | INTRAVENOUS | Status: DC

## 2020-12-19 NOTE — Progress Notes (Signed)
To PACU, VSS. Report to [email or web address] 

## 2020-12-19 NOTE — Progress Notes (Signed)
VS by CW  Pt's states no medical or surgical changes since previsit or office visit.  

## 2020-12-19 NOTE — Patient Instructions (Signed)
Impression/Recommendations:  Gastritis handout given to patient.  Resume previous diet. Continue present medications including Protonix.  Await pathology results.  Check weight every week.  Please report if there is any further weight loss.  If so, proceed with CT Abdo/pelvis followed by GES.  Reassess for colonoscopy at follow-up in 12 weeks.  YOU HAD AN ENDOSCOPIC PROCEDURE TODAY AT [facility]:   Refer to the procedure report that was given to you for any specific questions about what was found during the examination.  If the procedure report does not answer your questions, please call your gastroenterologist to clarify.  If you requested that your care partner not be given the details of your procedure findings, then the procedure report has been included in a sealed envelope for you to review at your convenience later.  YOU SHOULD EXPECT: Some feelings of bloating in the abdomen. Passage of more gas than usual.  Walking can help get rid of the air that was put into your GI tract during the procedure and reduce the bloating. If you had a lower endoscopy (such as a colonoscopy or flexible sigmoidoscopy) you may notice spotting of blood in your stool or on the toilet paper. If you underwent a bowel prep for your procedure, you may not have a normal bowel movement for a few days.  Please Note:  You might notice some irritation and congestion in your nose or some drainage.  This is from the oxygen used during your procedure.  There is no need for concern and it should clear up in a day or so.  SYMPTOMS TO REPORT IMMEDIATELY:  Following upper endoscopy (EGD)  Vomiting of blood or coffee ground material  New chest pain or pain under the shoulder blades  Painful or persistently difficult swallowing  New shortness of breath  Fever of 100F or higher  Black, tarry-looking stools  For urgent or emergent issues, a gastroenterologist can be reached at any hour by calling [phone]  [phone]. Do not use MyChart messaging for urgent concerns.    DIET:  We do recommend a small meal at first, but then you may proceed to your regular diet.  Drink plenty of fluids but you should avoid alcoholic beverages for 24 hours.  ACTIVITY:  You should plan to take it easy for the rest of today and you should NOT DRIVE or use heavy machinery until tomorrow (because of the sedation medicines used during the test).    FOLLOW UP: Our staff will call the number listed on your records 48-72 hours following your procedure to check on you and address any questions or concerns that you may have regarding the information given to you following your procedure. If we do not reach you, we will leave a message.  We will attempt to reach you two times.  During this call, we will ask if you have developed any symptoms of COVID 19. If you develop any symptoms (ie: fever, flu-like symptoms, shortness of breath, cough etc.) before then, please call [phone].  If you test positive for Covid 19 in the 2 weeks post procedure, please call and report this information to [place].    If any biopsies were taken you will be contacted by phone or by letter within the next 1-3 weeks.  Please call us at [phone] if you have not heard about the biopsies in 3 weeks.    SIGNATURES/CONFIDENTIALITY: You and/or your care partner have signed paperwork which will be entered into your electronic medical  record.  These signatures attest to the fact that that the information above on your After Visit Summary has been reviewed and is understood.  Full responsibility of the confidentiality of this discharge information lies with you and/or your care-partner.

## 2020-12-19 NOTE — Op Note (Signed)
[place] Patient Name: Sean Lin Procedure Date: 12/19/2020 3:40 PM MRN: [id number] Endoscopist: Jackquline Denmark , MD Age: 86 Referring MD:  Date of Birth: 1935-05-26 Gender: Male Account #: [id number] Procedure:                Upper GI endoscopy Indications:              Epigastric abdominal pain. N/V, wt loss, early                            satiety Medicines:                Monitored Anesthesia Care Procedure:                Pre-Anesthesia Assessment:                           - Prior to the procedure, a History and Physical                            was performed, and patient medications and                            allergies were reviewed. The patient's tolerance of                            previous anesthesia was also reviewed. The risks                            and benefits of the procedure and the sedation                            options and risks were discussed with the patient.                            All questions were answered, and informed consent                            was obtained. Prior Anticoagulants: The patient has                            taken no previous anticoagulant or antiplatelet                            agents. ASA Grade Assessment: III - A patient with                            severe systemic disease. After reviewing the risks                            and benefits, the patient was deemed in                            satisfactory condition to undergo the procedure.  After obtaining informed consent, the endoscope was                            passed under direct vision. Throughout the                            procedure, the patient's blood pressure, pulse, and                            oxygen saturations were monitored continuously. The                            Endoscope was introduced through the mouth, and                            advanced to the second part of duodenum. The upper                             GI endoscopy was accomplished without difficulty.                            The patient tolerated the procedure well. Scope In: Scope Out: Findings:                 The examined esophagus was normal.                           Diffuse mild inflammation characterized by erythema                            was found in the gastric body and in the gastric                            antrum. Biopsies were taken with a cold forceps for                            histology.                           The examined duodenum was normal. Biopsies for                            histology were taken with a cold forceps for                            evaluation of celiac disease. Complications:            No immediate complications. Estimated Blood Loss:     Estimated blood loss: none. Impression:               -Mild gastritis.                           -Otherwise normal EGD. Recommendation:           - Patient has a contact number available for  emergencies. The signs and symptoms of potential                            delayed complications were discussed with the                            patient. Return to normal activities tomorrow.                            Written discharge instructions were provided to the                            patient.                           - Resume previous diet.                           - Continue present medications including Protonix.                           - Await patholog includingy results.                           - Check weight every week. Please report if there                            is any further weight loss. If he does, will                            proceed with CT Abdo/pelvis followed by GES.                           - I agree that he is not strong enough to go in for                            colonoscopy at this time. Will reassess when he                            comes for follow-up in  12 weeks.                           - The findings and recommendations were discussed                            with the patient's family Mechele Claude and [place]). Jackquline Denmark, MD 12/19/2020 4:17:58 PM This report has been signed electronically.

## 2020-12-19 NOTE — Progress Notes (Signed)
Called to room to assist during endoscopic procedure.  Patient ID and intended procedure confirmed with present staff. Received instructions for my participation in the procedure from the performing physician.  

## 2020-12-23 ENCOUNTER — Telehealth: Payer: Self-pay

## 2020-12-23 NOTE — Telephone Encounter (Signed)
  Follow up Call-  Call back number 12/19/2020  Post procedure Call Back phone  # [phone]  Permission to leave phone message Yes  Some recent data might be hidden     Patient questions:  Do you have a fever, pain , or abdominal swelling? No. Pain Score  0 *  Have you tolerated food without any problems? Yes.    Have you been able to return to your normal activities? Yes.    Do you have any questions about your discharge instructions: Diet   No. Medications  No. Follow up visit  No.  Do you have questions or concerns about your Care? No.  Actions: * If pain score is 4 or above: 1. No action needed, pain <4.Have you developed a fever since your procedure? no  2.   Have you had an respiratory symptoms (SOB or cough) since your procedure? no  3.   Have you tested positive for COVID 19 since your procedure no  4.   Have you had any family members/close contacts diagnosed with the COVID 19 since your procedure?  no   If yes to any of these questions please route to Joylene John, RN and Joella Prince, RN

## 2020-12-30 ENCOUNTER — Encounter: Payer: Self-pay | Admitting: Gastroenterology

## 2021-01-02 ENCOUNTER — Telehealth: Payer: Self-pay | Admitting: Emergency Medicine

## 2021-01-02 NOTE — Telephone Encounter (Signed)
Thank you :)

## 2021-01-02 NOTE — Telephone Encounter (Signed)
Spoke with pt's wife who stated pt has been given dx of pneumonia and is currently being treated with antibiotics. According to pt's wife pt's CT has been postponed until next r/t pneumonia dx. Forwarding to Dr. Lamonte Sakai as Juluis Rainier. Nothing further needed at this time.

## 2021-01-20 ENCOUNTER — Other Ambulatory Visit: Payer: Self-pay | Admitting: Oncology

## 2021-01-21 DIAGNOSIS — A419 Sepsis, unspecified organism: Secondary | ICD-10-CM | POA: Diagnosis not present

## 2021-01-22 DIAGNOSIS — A419 Sepsis, unspecified organism: Secondary | ICD-10-CM | POA: Diagnosis not present

## 2021-02-16 ENCOUNTER — Telehealth: Payer: Self-pay | Admitting: Emergency Medicine

## 2021-02-16 NOTE — Telephone Encounter (Signed)
FYI for RB. 

## 2021-02-17 NOTE — Telephone Encounter (Signed)
Condolence card obtained and placed in RB's papers.

## 2021-02-17 NOTE — Telephone Encounter (Signed)
Thank y9ou for letting me know. Can we send a condolence card to his wife? Thanks.

## 2021-03-01 DEATH — deceased
